# Patient Record
Sex: Female | Born: 1940 | Race: White | Hispanic: No | Marital: Married | Smoking: Former smoker
Health system: Southern US, Community
[De-identification: ages and names within clinical notes are randomized; demographics above are authoritative.]

## PROBLEM LIST (undated history)

## (undated) DIAGNOSIS — I10 Essential (primary) hypertension: Secondary | ICD-10-CM

## (undated) DIAGNOSIS — I739 Peripheral vascular disease, unspecified: Secondary | ICD-10-CM

## (undated) DIAGNOSIS — T8859XA Other complications of anesthesia, initial encounter: Secondary | ICD-10-CM

## (undated) DIAGNOSIS — T4145XA Adverse effect of unspecified anesthetic, initial encounter: Secondary | ICD-10-CM

## (undated) DIAGNOSIS — R0602 Shortness of breath: Secondary | ICD-10-CM

## (undated) DIAGNOSIS — R112 Nausea with vomiting, unspecified: Secondary | ICD-10-CM

## (undated) DIAGNOSIS — Z9889 Other specified postprocedural states: Secondary | ICD-10-CM

## (undated) DIAGNOSIS — R911 Solitary pulmonary nodule: Secondary | ICD-10-CM

## (undated) DIAGNOSIS — F32A Depression, unspecified: Secondary | ICD-10-CM

## (undated) DIAGNOSIS — J4489 Other specified chronic obstructive pulmonary disease: Secondary | ICD-10-CM

## (undated) DIAGNOSIS — E785 Hyperlipidemia, unspecified: Secondary | ICD-10-CM

## (undated) DIAGNOSIS — J449 Chronic obstructive pulmonary disease, unspecified: Secondary | ICD-10-CM

## (undated) DIAGNOSIS — I2510 Atherosclerotic heart disease of native coronary artery without angina pectoris: Secondary | ICD-10-CM

## (undated) DIAGNOSIS — F329 Major depressive disorder, single episode, unspecified: Secondary | ICD-10-CM

## (undated) DIAGNOSIS — M1990 Unspecified osteoarthritis, unspecified site: Secondary | ICD-10-CM

## (undated) HISTORY — DX: Shortness of breath: R06.02

## (undated) HISTORY — DX: Essential (primary) hypertension: I10

## (undated) HISTORY — DX: Depression, unspecified: F32.A

## (undated) HISTORY — DX: Chronic obstructive pulmonary disease, unspecified: J44.9

## (undated) HISTORY — DX: Hyperlipidemia, unspecified: E78.5

## (undated) HISTORY — DX: Solitary pulmonary nodule: R91.1

## (undated) HISTORY — PX: HAMMER TOE SURGERY: SHX385

## (undated) HISTORY — PX: TUBAL LIGATION: SHX77

## (undated) HISTORY — DX: Atherosclerotic heart disease of native coronary artery without angina pectoris: I25.10

## (undated) HISTORY — PX: WRIST SURGERY: SHX841

## (undated) HISTORY — DX: Other specified chronic obstructive pulmonary disease: J44.89

## (undated) HISTORY — DX: Major depressive disorder, single episode, unspecified: F32.9

## (undated) HISTORY — DX: Peripheral vascular disease, unspecified: I73.9

## (undated) HISTORY — PX: OTHER SURGICAL HISTORY: SHX169

---

## 2002-01-07 DIAGNOSIS — R911 Solitary pulmonary nodule: Secondary | ICD-10-CM

## 2002-01-07 HISTORY — DX: Solitary pulmonary nodule: R91.1

## 2003-07-11 HISTORY — PX: CORONARY ARTERY BYPASS GRAFT: SHX141

## 2003-11-17 ENCOUNTER — Inpatient Hospital Stay (HOSPITAL_COMMUNITY): Admission: EM | Admit: 2003-11-17 | Discharge: 2003-11-26 | Payer: Self-pay | Admitting: Cardiology

## 2004-07-18 ENCOUNTER — Ambulatory Visit: Payer: Self-pay | Admitting: Cardiology

## 2004-07-26 ENCOUNTER — Ambulatory Visit: Payer: Self-pay

## 2004-08-05 ENCOUNTER — Ambulatory Visit: Payer: Self-pay | Admitting: Pulmonary Disease

## 2004-08-22 ENCOUNTER — Ambulatory Visit: Payer: Self-pay

## 2004-09-05 ENCOUNTER — Ambulatory Visit: Payer: Self-pay | Admitting: Pulmonary Disease

## 2005-09-07 ENCOUNTER — Ambulatory Visit: Payer: Self-pay | Admitting: Cardiology

## 2006-01-07 IMAGING — CT CT ANGIO CHEST
4 of 5 series · 19 of 30 positions shown · IV contrast ([ID] OMNI 300)
Comparison: none

CLINICAL DATA: Shortness of breath.  The patient is unable to wean off oxygen.  Status post CABG one week ago.
 CT ANGIOGRAPHY OF THE CHEST WITH CONTRAST
 Scans are performed following intravenous injection of 120 cc Omnipaque 300.
 There is no evidence of pulmonary embolus.  There is a moderate left pleural effusion with some compressive atelectasis of the left lower lobe.  There is mild cardiomegaly with no pericardial effusion.  There is a tiny right effusion.  There is some minimal interstitial disease at the right lung apex without discrete consolidation.  There is no significant hilar or mediastinal adenopathy.  Expected post surgical changes in the sternum and mediastinum from the recent CABG.
 IMPRESSION
 No evidence of pulmonary embolus.  Tiny right pleural effusion.  Small left effusion with moderate atelectasis in the left lower lobe.

[Series 5: recon 2: pe · axial · 0.68mm/px · z∈[-182,-87]mm · 4 of 76 slices shown]
[im 19/76  lung]
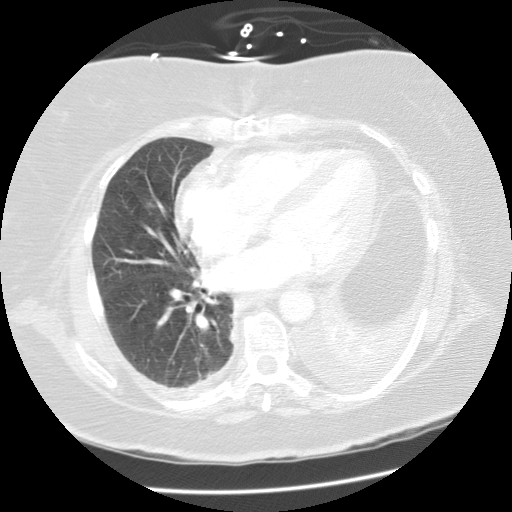
[im 36/76  lung]
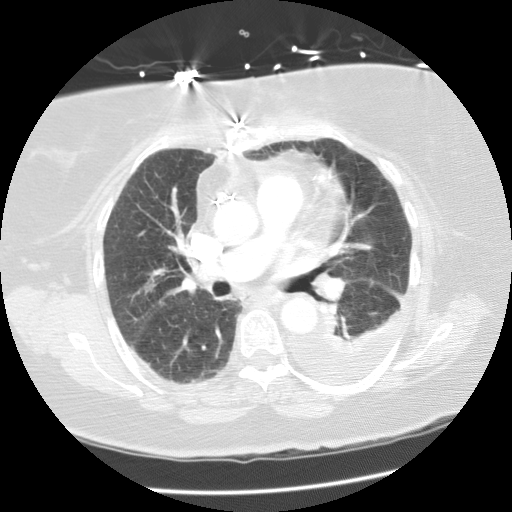
[im 38/76  lung]
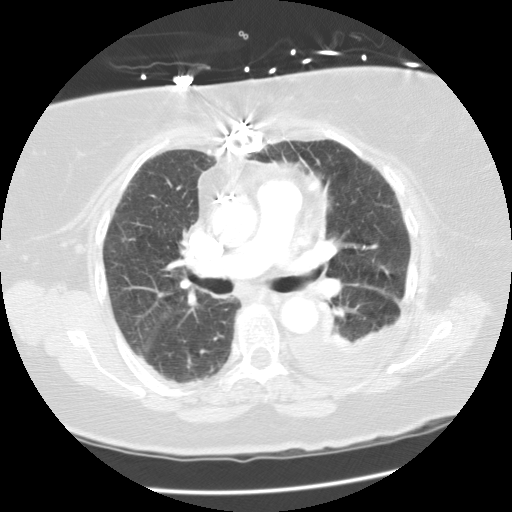
[im 57/76  lung]
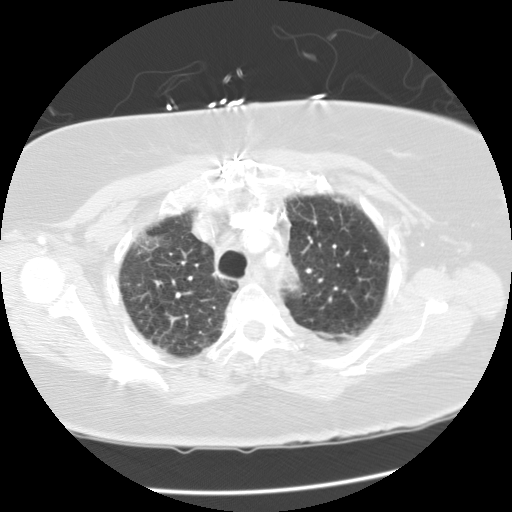

[Series 6: recon 3: pe · axial · 0.68mm/px · z∈[-207,-61]mm · 9 of 151 slices shown]
[im 17/151  lung]
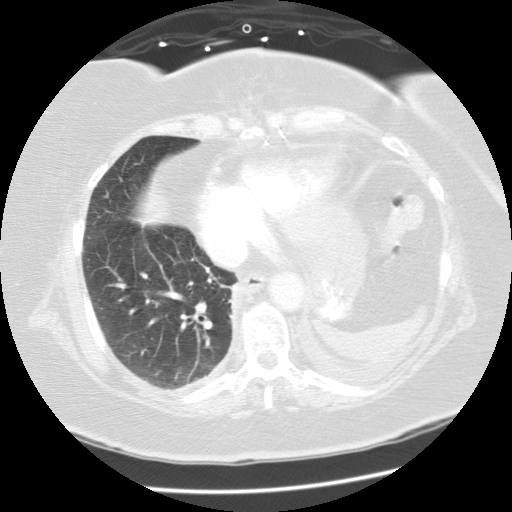
[im 34/151  mediastinal]
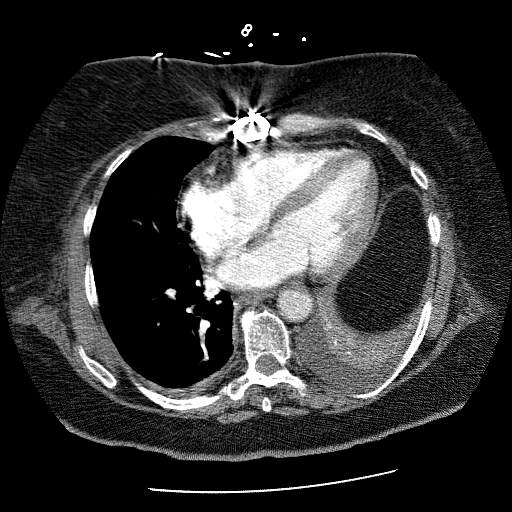
[im 51/151  lung]
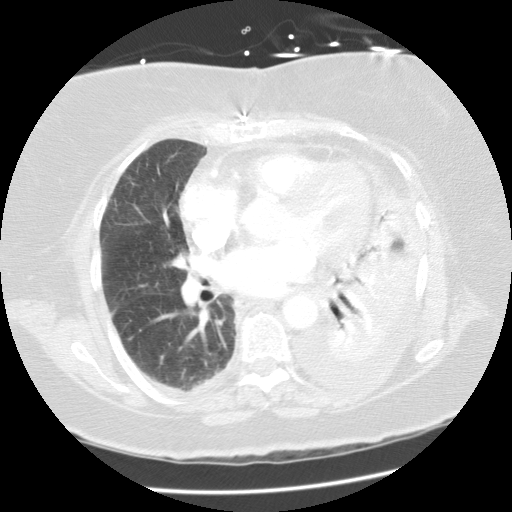
[im 67/151  mediastinal]
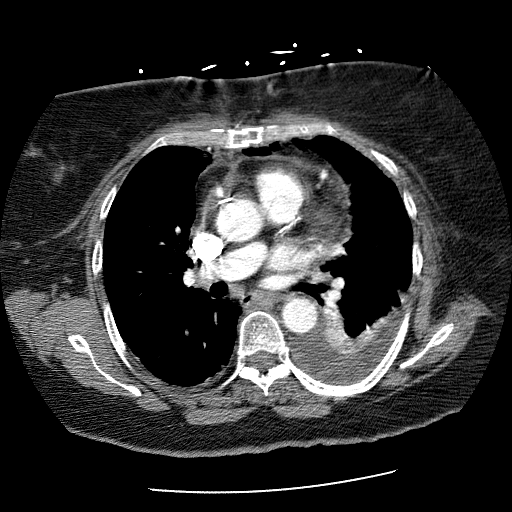
[im 71/151  lung]
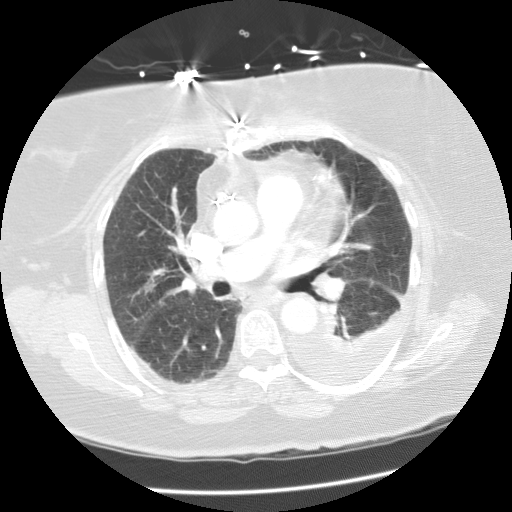
[im 84/151  mediastinal]
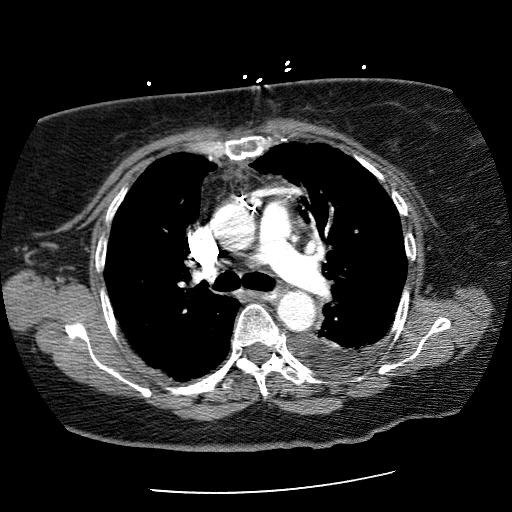
[im 101/151  lung]
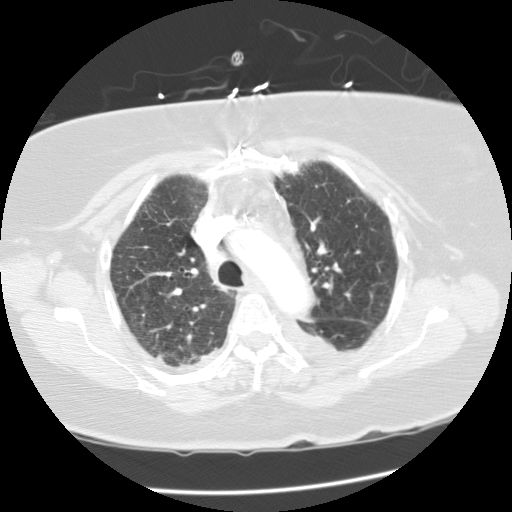
[im 117/151  mediastinal]
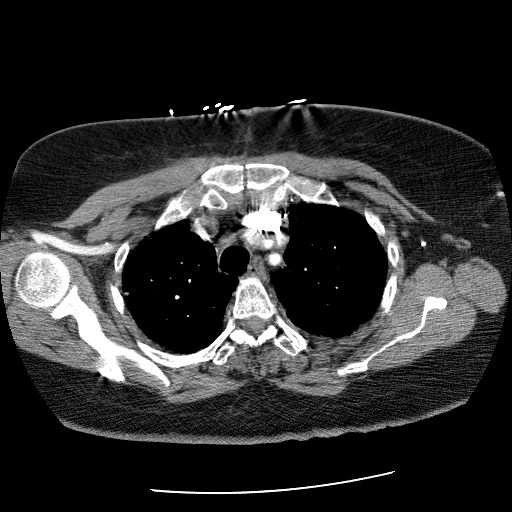
[im 134/151  lung]
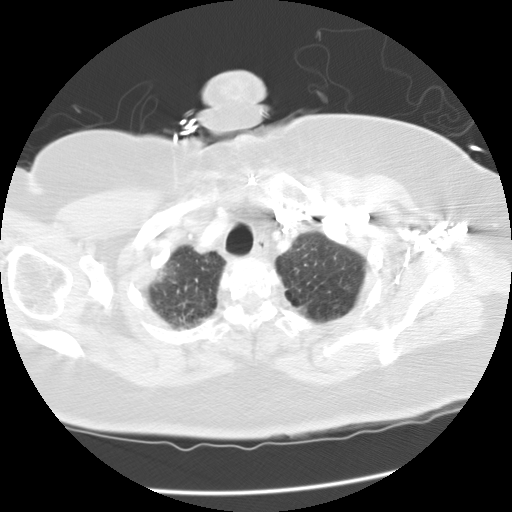

[Series 527: reformatted · sagittal · 0.39mm/px · 4 of 90 slices shown (1 of 2)]
[im 18/90  lung]
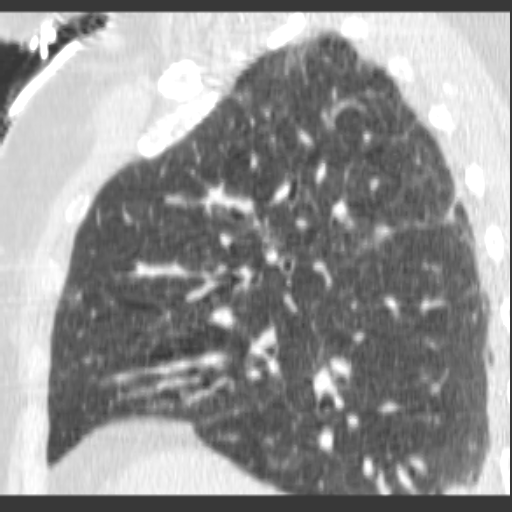
[im 36/90  lung]
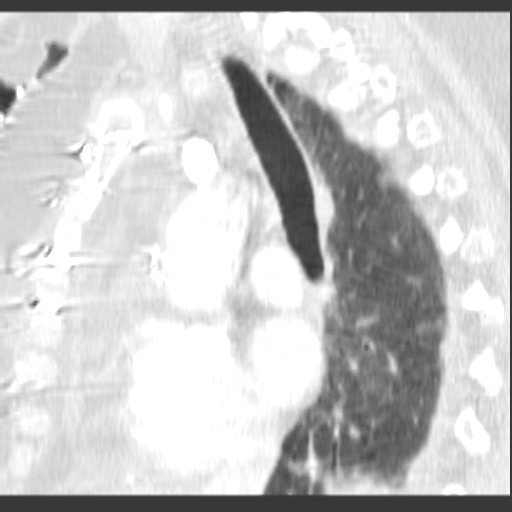
[im 54/90  lung]
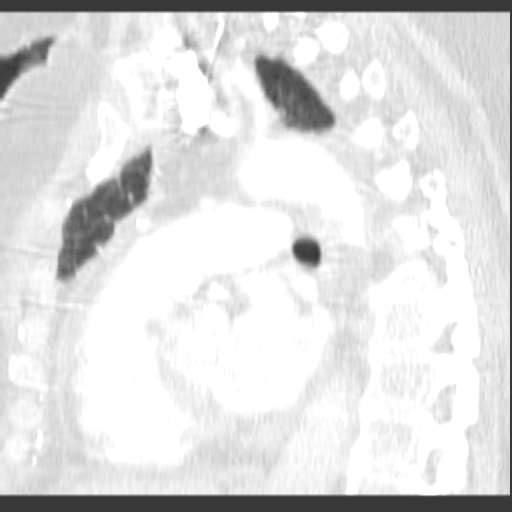
[im 72/90  lung]
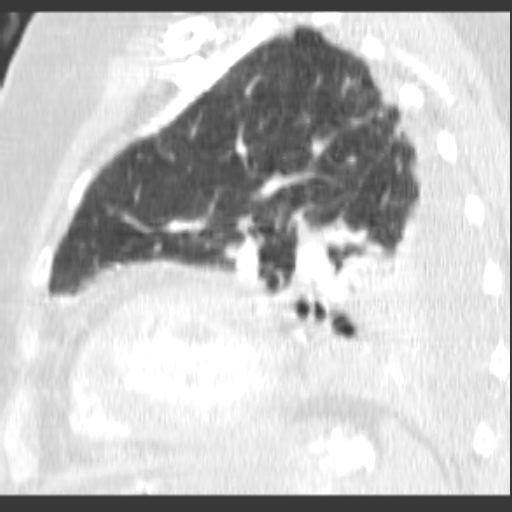

[Series 529: reformatted · coronal · 0.39mm/px · 2 of 96 slices shown (2 of 2)]
[im 20/96  lung]
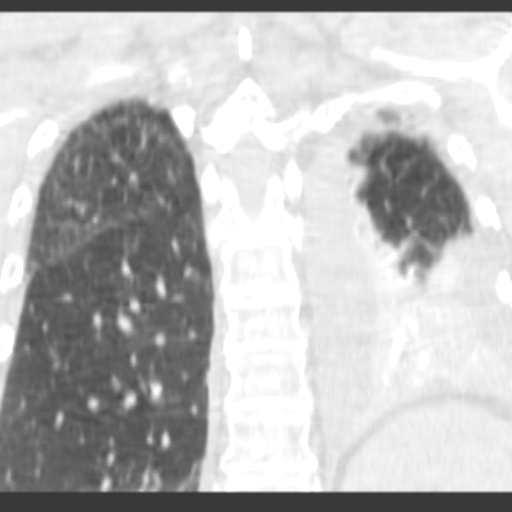
[im 39/96  lung]
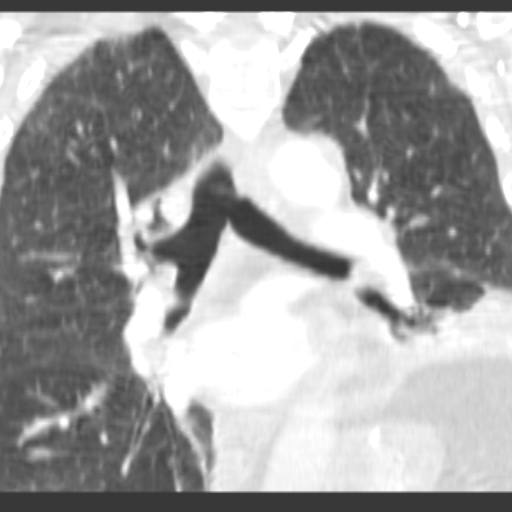

[19 of 30 positions shown; findings below may reference images not displayed]

## 2008-10-01 ENCOUNTER — Encounter: Payer: Self-pay | Admitting: Cardiology

## 2008-10-22 ENCOUNTER — Encounter: Payer: Self-pay | Admitting: Cardiology

## 2008-10-22 ENCOUNTER — Ambulatory Visit: Payer: Self-pay | Admitting: Cardiology

## 2008-10-29 ENCOUNTER — Ambulatory Visit: Payer: Self-pay | Admitting: Cardiology

## 2008-10-29 ENCOUNTER — Encounter: Payer: Self-pay | Admitting: Cardiology

## 2008-11-02 ENCOUNTER — Telehealth: Payer: Self-pay | Admitting: Cardiology

## 2008-11-03 ENCOUNTER — Telehealth: Payer: Self-pay | Admitting: Cardiology

## 2008-11-04 ENCOUNTER — Telehealth (INDEPENDENT_AMBULATORY_CARE_PROVIDER_SITE_OTHER): Payer: Self-pay | Admitting: *Deleted

## 2008-11-05 ENCOUNTER — Telehealth: Payer: Self-pay | Admitting: Cardiology

## 2009-10-14 ENCOUNTER — Telehealth: Payer: Self-pay | Admitting: Cardiology

## 2009-10-15 ENCOUNTER — Telehealth: Payer: Self-pay | Admitting: Cardiology

## 2009-11-17 DIAGNOSIS — E785 Hyperlipidemia, unspecified: Secondary | ICD-10-CM | POA: Insufficient documentation

## 2009-11-17 DIAGNOSIS — J4489 Other specified chronic obstructive pulmonary disease: Secondary | ICD-10-CM | POA: Insufficient documentation

## 2009-11-17 DIAGNOSIS — I10 Essential (primary) hypertension: Secondary | ICD-10-CM

## 2009-11-17 DIAGNOSIS — J449 Chronic obstructive pulmonary disease, unspecified: Secondary | ICD-10-CM | POA: Insufficient documentation

## 2009-11-18 ENCOUNTER — Ambulatory Visit: Payer: Self-pay | Admitting: Cardiology

## 2009-11-19 DIAGNOSIS — E669 Obesity, unspecified: Secondary | ICD-10-CM | POA: Insufficient documentation

## 2009-11-19 DIAGNOSIS — I2510 Atherosclerotic heart disease of native coronary artery without angina pectoris: Secondary | ICD-10-CM | POA: Insufficient documentation

## 2009-11-22 ENCOUNTER — Telehealth (INDEPENDENT_AMBULATORY_CARE_PROVIDER_SITE_OTHER): Payer: Self-pay | Admitting: *Deleted

## 2009-11-29 ENCOUNTER — Telehealth: Payer: Self-pay | Admitting: Cardiology

## 2010-07-31 ENCOUNTER — Encounter: Payer: Self-pay | Admitting: Thoracic Surgery (Cardiothoracic Vascular Surgery)

## 2010-08-08 ENCOUNTER — Telehealth (INDEPENDENT_AMBULATORY_CARE_PROVIDER_SITE_OTHER): Payer: Self-pay | Admitting: *Deleted

## 2010-08-09 NOTE — Progress Notes (Signed)
Summary: Low HR after Increasing Metoprolol  Phone Note Call from Patient Call back at [facility] Phone [phone]   Summary of Call: Pt seen in [facility] office last week. She states she told the girl who was putting in her meds that she takes Lopressor 50mg  1/2 tablet two times a day. This is listed in IDX as 1 tablet two times a day. Pt states she increased her Lopressor to this dosage per Dr. Jenene Slicker recommendation, though, she states again that she has always taken 1/2 tablet two times a day. She states she saw Orvilla Cornwall, PA today and her HR was 51 after increasing Lopressor dosage. She states Keavie told her to contact Dr. Antoine Poche regarding HR being low after increasing dosage. She states she contacted the [facility] office but was told to call the [place] office because Dr. Antoine Poche was here today. Initial call taken by: Cyril Loosen, RN, BSN,  Nov 22, 2009 4:28 PM  Follow-up for Phone Call        Patient should continue the higher dose unless she is having symptoms with her low heart rate.  (Note that the patient will be followed in [place] from now on.) Follow-up by: Rollene Rotunda, MD, [facility],  Nov 22, 2009 4:48 PM  Additional Follow-up for Phone Call Additional follow up Details #1::        Pt notified. Pt denies any symptoms with HR of 51. She will notify us of any problems in the future and continue Lopressor as directed. Additional Follow-up by: Cyril Loosen, RN, BSN,  Nov 22, 2009 5:01 PM

## 2010-08-09 NOTE — Progress Notes (Signed)
Summary: refill  Phone Note Refill Request Call back at Home Phone [phone] Message from:  Patient  Refills  -- Crestor Metoprolol  Ramipril  Prozac       [facility] [place]     Method Requested: Telephone to Pharmacy Initial call taken by: Burnard Leigh,  October 14, 2009 3:38 PM  Follow-up for Phone Call        PT CALLING ABCK THIS MORNING ABOUT HER MEDICATIONS  Judie Grieve  October 15, 2009 9:11 AM    New/Updated Medications: CRESTOR 40 MG TABS (ROSUVASTATIN CALCIUM) Take one tablet by mouth daily. Prescriptions: CRESTOR 40 MG TABS (ROSUVASTATIN CALCIUM) Take one tablet by mouth daily.  #90 x 3   Entered by:   Kem Parkinson   Authorized by:   Rollene Rotunda, MD, [facility]   Signed by:   Kem Parkinson on 10/15/2009   Method used:   Electronically to        [facility] (retail)       [address]       [place], [place]  [address]       Ph: [phone]       Fax: [phone]   RxID:   [id number] RAMIPRIL 5 MG CAPS (RAMIPRIL) 1 tab once daily  #90 x 3   Entered by:   Kem Parkinson   Authorized by:   Rollene Rotunda, MD, [facility]   Signed by:   Kem Parkinson on 10/15/2009   Method used:   Electronically to        [facility] (retail)       [address]       [place], [place]  [address]       Ph: [phone]       Fax: [phone]   RxID:   [id number] LOPRESSOR 50 MG TABS (METOPROLOL TARTRATE) 2  each day  #90 x 3   Entered by:   Kem Parkinson   Authorized by:   Rollene Rotunda, MD, [facility]   Signed by:   Kem Parkinson on 10/15/2009   Method used:   Electronically to        [facility] (retail)       [address]       [place], [place]  [address]       Ph: [phone]       Fax: [phone]   RxID:   [id number]

## 2010-08-09 NOTE — Assessment & Plan Note (Signed)
Summary: yearly/sl   Visit Type:  Follow-up Primary Provider:  Orvilla Cornwall, NP  CC:  CAD/HTN.  History of Present Illness: The patient presents today for followup. Since I last saw her she has had no new cardiovascular complaints. She denies any chest pressure, neck or arm discomfort. She's had no new shortness of breath, PND or orthopnea. She has no palpitations, presyncope or syncope. She is unfortunately not particularly active and is not walking. She does have some aching leg pain and she wonders might be related to the Crestor. She's had none of the symptoms that were her previous angina. Her most exerting activity as vacuuming.  Current Medications (verified): 1)  Adult Aspirin Ec Low Strength 81 Mg Tbec (Aspirin) .... 2 Tablets Every Day 2)  Tylenol Pm Extra Strength 500-25 Mg Tabs (Diphenhydramine-Apap (Sleep)) .... As Needed 3)  Calcium Citrate 200 Mg Tabs (Calcium Citrate) .Marland Kitchen.. 1 Tab Each Day 4)  Multivitamins  Tabs (Multiple Vitamin) .Marland Kitchen.. 1 Tab Once Daily 5)  Crestor 40 Mg Tabs (Rosuvastatin Calcium) .... Take One Tablet By Mouth Daily. 6)  Fluoxetine Hcl 20 Mg Caps (Fluoxetine Hcl) .... 2 Tabs Each Day 7)  Lopressor 50 Mg Tabs (Metoprolol Tartrate) .... 2  Each Day 8)  Ramipril 5 Mg Caps (Ramipril) .Marland Kitchen.. 1 Tab Once Daily  Allergies (verified): 1)  ! Neosporin 2)  ! * Polysporin  Past History:  Past Medical History: Reviewed history from 11/17/2009 and no changes required.  1. Hypertension.  2. Hyperlipidemia.  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  4. History of depression.  5. Small nodule to the right middle lung on CT in July of 2003.  Three     nodules were noted on CT in 2001.  Past Surgical History: CABG (2005 Dr. Laneta Simmers (saphenous vein graft to the     left anterior descending, saphenous vein graft to the ramus intermedius,     saphenous vein graft to the right coronary artery)  Review of Systems       As stated in the HPI and  negative for all other systems.   Vital Signs:  Patient profile:   70 year old female Height:      62 inches Weight:      207 pounds BMI:     38.00 Pulse rate:   64 / minute Resp:     16 per minute BP sitting:   212 / 116  (right arm)  Vitals Entered By: Marrion Coy, CNA (Nov 18, 2009 11:26 AM)  Serial Vital Signs/Assessments:  Time      Position  BP       Pulse  Resp  Temp     By                     196/110                        Marrion Coy, CNA 12:45               155/89   57                    Charolotte Capuchin, RN  Comments: 12:45 BP recheck after recieving Clonidine 0.1 1/2 tablet  By: Charolotte Capuchin, RN    Physical Exam  General:  Well developed, well nourished, in no acute distress. Head:  normocephalic and atraumatic Eyes:  PERRLA/EOM intact; conjunctiva and lids normal. Mouth:  Teeth, gums and palate  normal. Oral mucosa normal. Neck:  Neck supple, no JVD. No masses, thyromegaly or abnormal cervical nodes. Chest Wall:  Well-healed sternotomy scar Lungs:  Clear bilaterally to auscultation and percussion. Abdomen:  Bowel sounds positive; abdomen soft and non-tender without masses, organomegaly, or hernias noted. No hepatosplenomegaly. Msk:  Back normal, normal gait. Muscle strength and tone normal. Extremities:  No clubbing or cyanosis. Neurologic:  Alert and oriented x 3. Skin:  Intact without lesions or rashes. Cervical Nodes:  no significant adenopathy Inguinal Nodes:  no significant adenopathy Psych:  Normal affect.   Detailed Cardiovascular Exam  Neck    Carotids: Carotids full and equal bilaterally without bruits.      Neck Veins: Normal, no JVD.    Heart    Inspection: no deformities or lifts noted.      Palpation: normal PMI with no thrills palpable.      Auscultation: regular rate and rhythm, S1, S2 without murmurs, rubs, gallops, or clicks.    Vascular    Abdominal Aorta: no palpable masses, pulsations, or audible bruits.       Femoral Pulses: normal femoral pulses bilaterally.      Pedal Pulses: normal pedal pulses bilaterally.      Radial Pulses: normal radial pulses bilaterally.      Peripheral Circulation: no clubbing, cyanosis, or edema noted with normal capillary refill.     EKG  Procedure date:  11/19/2009  Findings:      sinus rhythm, rate 64, axis within normal limits, intervals within normal limits, no acute ST-T wave changes.  Impression & Recommendations:  Problem # 1:  CAD (ICD-414.00) She is status post CABG. She is having no symptoms. I encourage increased activity but no further testing is suggested.  Problem # 2:  OBESITY, UNSPECIFIED (ICD-278.00) She understands the need to lose weight with diet and exercise.  Problem # 3:  HYPERTENSION (ICD-401.9) Her blood pressure she says is well controlled at home though typically with systolics at the upper limits of normal. She was hypertensive last time when I saw her. We gave her clonidine to bring her pressure down. It was down as recorded above before she left the office. I will increase her grandmother per L. to 2.5 mg in the evening and 5 mg in the morning. She can keep a blood pressure diary and follow with her primary. Orders: EKG w/ Interpretation (93000)  Patient Instructions: 1)  Your physician recommends that you schedule a follow-up appointment in: 1 yr in [place] office 2)  Your physician recommends that you continue on your current medications as directed. Please refer to the Current Medication list given to you today.

## 2010-08-09 NOTE — Progress Notes (Signed)
     Follow-up for Phone Call       Follow-up by: Rollene Rotunda, MD, [facility],  October 17, 2009 9:23 AM    Additional Follow-up for Phone Call Additional follow up Details #2::    Pt wants to know if Dr. Antoine Poche will fill her prozac....... She states she would like a call back to know if he fills it or not Follow-up by: Kem Parkinson,  October 15, 2009 10:18 AM  Additional Follow-up for Phone Call Additional follow up Details #3:: Details for Additional Follow-up Action Taken: I will not fill Prozac.  Needs to come from primary MD.  Pt is aware she needs to contact her primary MD. for Prozac. Lisabeth Devoid RN BSN Additional Follow-up by: Rollene Rotunda, MD, [facility],  October 17, 2009 9:24 AM

## 2010-08-09 NOTE — Progress Notes (Signed)
Summary: question regarding meds - metoprolol  Phone Note Call from Patient Call back at Home Phone [phone]   Caller: Patient Reason for Call: Talk to Nurse Summary of Call: per pt calling, has question regarding dosage of meds metoprolol 50 mg  Initial call taken by: Lorne Skeens,  Nov 29, 2009 10:02 AM  Follow-up for Phone Call        Pt states she saw PA at primary MD Orvilla Cornwall). She states now that she has been taking 1 tablet two times a day of the Metoprolol she feels lethargic but denies any dizziness, near-syncope or other symptoms. She will try the increased dose of Metoprolol for a week or so and go back to 1/2 tablet twice per day if unable to tolerate.  Follow-up by: Cyril Loosen, RN, BSN,  Nov 29, 2009 4:27 PM

## 2010-08-17 NOTE — Progress Notes (Signed)
Summary: refill request  Phone Note Refill Request Message from:  Patient on August 08, 2010 9:53 AM  metoprolol to take two a day per dr hochrein/needs new rx for the change/eden called to say dr hochrein in [address] should take care of it   Method Requested: Telephone to Pharmacy Initial call taken by: Glynda Jaeger,  August 08, 2010 9:54 AM    Prescriptions: LOPRESSOR 50 MG TABS (METOPROLOL TARTRATE) 2  each day  #180 x 1   Entered by:   Carlye Grippe   Authorized by:   Rollene Rotunda, MD, [facility]   Signed by:   Carlye Grippe on 08/08/2010   Method used:   Electronically to        [facility] (retail)       [address]       [place], [place]  [address]       Ph: [phone]       Fax: [phone]   RxID:   [id number]

## 2010-11-08 ENCOUNTER — Encounter: Payer: Self-pay | Admitting: Cardiology

## 2010-11-08 ENCOUNTER — Ambulatory Visit (INDEPENDENT_AMBULATORY_CARE_PROVIDER_SITE_OTHER): Payer: Medicare Other | Admitting: Cardiology

## 2010-11-08 DIAGNOSIS — E785 Hyperlipidemia, unspecified: Secondary | ICD-10-CM

## 2010-11-08 DIAGNOSIS — I10 Essential (primary) hypertension: Secondary | ICD-10-CM

## 2010-11-08 DIAGNOSIS — I2510 Atherosclerotic heart disease of native coronary artery without angina pectoris: Secondary | ICD-10-CM

## 2010-11-08 DIAGNOSIS — E669 Obesity, unspecified: Secondary | ICD-10-CM

## 2010-11-08 NOTE — Progress Notes (Signed)
HPI The patient presents for yearly followup. Since I last saw her she has had no new cardiovascular problem. She walks 10 minutes on the treadmill daily. With this she denies any chest pressure, neck or arm discomfort. She has no palpitations, presyncope or syncope. She does get some shortness of breath with activity but this is baseline. She denies any PND or orthopnea. She has no weight gain or edema. She has unfortunately been unable to lose weight.  Allergies  Allergen Reactions  . Bacitracin-Polymyxin B   . Triple Antibiotic     Current Outpatient Prescriptions  Medication Sig Dispense Refill  . aspirin 81 MG tablet Take 162 mg by mouth daily.        . Calcium Carbonate-Vitamin D (CALTRATE 600+D) 600-400 MG-UNIT per tablet Take 4 tablets by mouth daily.        . diphenhydramine-acetaminophen (TYLENOL PM) 25-500 MG TABS Take 1 tablet by mouth at bedtime as needed.        Marland Kitchen FLUoxetine (PROZAC) 20 MG capsule Take 40 mg by mouth daily.        . metoprolol (LOPRESSOR) 50 MG tablet Take 50 mg by mouth 2 (two) times daily.        . Multiple Vitamin (MULTIVITAMIN) tablet Take 1 tablet by mouth daily.        . rosuvastatin (CRESTOR) 40 MG tablet Take 20 mg by mouth daily.          Past Medical History  Diagnosis Date  . Hypertension   . Hyperlipidemia   . 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  . Depression     History   . Nodule of right lung 01/2002    CT SCAN.  Three nodules were noted on CT in 2001.  Marland Kitchen Shortness of breath     Past Surgical History  Procedure Date  . Coronary artery bypass graft 2005    (saphenous vein graft to the  left anterior descending, saphenous vein graft to the ramus intermedius     saphenous vein graft to the right coronary artery  . Re-excision prepatellar bursa     Left Knee    ROS: As stated in the HPI and negative for all other systems.  PHYSICAL EXAM BP 168/79  Pulse 60  Ht 5\' 1"  (1.549 m)  Wt 213 lb  (96.616 kg)  BMI 40.25 kg/m2  SpO2 97% GENERAL:  Well appearing HEENT:  Pupils equal round and reactive, fundi not visualized, oral mucosa unremarkable NECK:  No jugular venous distention, waveform within normal limits, carotid upstroke brisk and symmetric, no bruits, no thyromegaly LYMPHATICS:  No cervical, inguinal adenopathy LUNGS:  Clear to auscultation bilaterally BACK:  No CVA tenderness CHEST:  Well healed sternotomy scar. HEART:  PMI not displaced or sustained,S1 and S2 within normal limits, no S3, no S4, no clicks, no rubs, no murmurs ABD:  Flat, positive bowel sounds normal in frequency in pitch, no bruits, no rebound, no guarding, no midline pulsatile mass, no hepatomegaly, no splenomegaly EXT:  2 plus pulses throughout, no edema, no cyanosis no clubbing SKIN:  No rashes no nodules NEURO:  Cranial nerves II through XII grossly intact, motor grossly intact throughout PSYCH:  Cognitively intact, oriented to person place and time  EKG:  Sinus rhythm, axis within normal limits, intervals within normal limits, no acute ST-T wave changes  ASSESSMENT AND PLAN

## 2010-11-08 NOTE — Assessment & Plan Note (Signed)
I reviewed her blood pressure diary. Her blood pressures typically well controlled though elevated today. I will make no change to her regimen.

## 2010-11-08 NOTE — Patient Instructions (Signed)
Your physician you to follow up in 1 year. You will receive a reminder letter in the mail one-two months in advance. If you don't receive a letter, please call our office to schedule the follow-up appointment. Your physician recommends that you continue on your current medications as directed. Please refer to the Current Medication list given to you today. 

## 2010-11-08 NOTE — Assessment & Plan Note (Signed)
The patient understands the need to lose weight with diet and exercise. We have discussed specific strategies for this.  

## 2010-11-08 NOTE — Assessment & Plan Note (Signed)
At this point I will make no change her medical regimen. She will continue with the meds as listed.

## 2010-11-08 NOTE — Assessment & Plan Note (Signed)
I will defer to her primary provider with a goal LDL less than 100 HDL greater than 40.

## 2010-11-25 NOTE — Cardiovascular Report (Signed)
NAME:  Annette Cooper, THUNE NO.:  [id number]   MEDICAL RECORD NO.:  [id number]                   PATIENT TYPE:  INP   LOCATION:  2310                                 FACILITY:  [facility]   PHYSICIAN:  Rollene Rotunda, M.D.                DATE OF BIRTH:  02-20-1941   DATE OF PROCEDURE:  07/09/2004  DATE OF DISCHARGE:                              CARDIAC CATHETERIZATION   PRIMARY CARE PHYSICIAN:  Dr. Weyman Pedro.   PROCEDURE:  Left heart catheterization, coronary arteriography.   INDICATION:  Evaluate patient with unstable angina.   PROCEDURE NOTE:  Left heart catheterization was performed via the right  femoral artery.  The artery was cannulated using an anterior wall puncture.  A 6 French arterial sheath was inserted via the modified Seldinger  technique.  Preformed Judkins and a pigtail catheter were utilized.  The  patient tolerated the procedure well and left the lab in stable condition.   RESULTS:   HEMODYNAMICS:  1. LV 183/14.  2. Aortic 181/83.   CORONARIES:  The left main had ostial 90% stenosis and distal 30% stenosis.  The LAD had proximal calcium with a long 30-40% lesion and mid 50% stenosis.  There were three small diagonals.  The circumflex in the AV groove was  normal.  There was a very large ramus intermediate which was normal.  The  right coronary artery was dominant.  There was long proximal 75% stenosis.   LEFT VENTRICULOGRAM:  Left ventriculogram was obtained in the RAO  projection.  The EF was 60% with normal wall motion.   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   PLAN:  The patient will be referred to CVTS for consideration of coronary  artery bypass graft.                                               Rollene Rotunda, M.D.    JH/MEDQ  D:  11/18/2003  T:  11/19/2003  Job:  [id number]   cc:   Dr. Weyman Pedro   [facility]

## 2010-11-25 NOTE — Discharge Summary (Signed)
NAME:  Annette Cooper, Annette Cooper                         ACCOUNT NO.:  [id number]   MEDICAL RECORD NO.:  [id number]                   PATIENT TYPE:  INP   LOCATION:  2020                                 FACILITY:  [facility]   PHYSICIAN:  Salvatore Decent. Dorris Fetch, M.D.         DATE OF BIRTH:  11/18/1940   DATE OF ADMISSION:  11/17/2003  DATE OF DISCHARGE:  11/24/2003                                 DISCHARGE SUMMARY   REFERRING PHYSICIAN:  Viviann Spare C. Dorris Fetch, M.D.   ADMISSION DIAGNOSIS:  Chest discomfort.   DISCHARGE DIAGNOSES:  1. 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  2. Postoperative anemia, status post transfusion.   PAST MEDICAL HISTORY:  1. Hypertension.  2. Hyperlipidemia.  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  4. History of depression.  5. Small nodule to the right middle lung on CT in July of 2003.  Three     nodules were noted on CT in 2001.   ALLERGIES:  No known drug allergies.  The patient expresses an intolerance  to NEOSPORIN.   HISTORY OF PRESENT ILLNESS:  The patient is a 70 year old female with a  history of hypertension and hyperlipidemia.  The patient had approximately  four episodes of chest discomfort over the last year with most recent  episode in February when she was admitted to rule out myocardial infarction  in [place].  The patient underwent a Cardiolite study there which was  negative for ischemia as interpreted by Dr. Myrtis Ser.  On the Sunday prior to  admission, the patient had a sudden onset of heaviness in her left shoulder  and left arm which radiated deep into the substernal area and then into the  throat.  These symptoms lasted approximately 15 to 20 minutes.  She did not  have nitroglycerin but was able to rest and her symptoms eventually  subsided.  The patient did have accompanying shortness of breath with  diaphoresis and nausea but no vomiting.  She was  subsequently admitted to  [facility] by Dr. Raul Del who felt the patient should undergo  coronary angiography.  The patient was then transferred from [facility] in [place], [place], via [facility] to [place]. [address] [facility] for a diagnostic heart catheterization.   HOSPITAL COURSE:  The patient was admitted and taken for cardiac  catheterization on Nov 17, 2003, by Dr. Rollene Rotunda.  Cardiac  catheterization revealed severe three vessel coronary artery disease and a  well-preserved ejection fraction.  Dr. Salvatore Decent. Hendrickson of [facility] was  subsequently consulted regarding surgical revascularization.  Dr.  Dorris Fetch evaluated the patient and reviewed the information on Nov 18, 2003, and it was his opinion that the patient should undergo coronary artery  bypass graft surgery.  The patient was taken to the OR on Nov 19, 2003, for  coronary artery bypass grafting x3.  The  saphenous vein was grafted to the  left anterior descending, saphenous vein was grafted to the ramus  intermedius, and saphenous vein was grafted to the right coronary artery.  Endoscopic vein harvest was performed on the left leg.  The patient  tolerated the procedure well and was hemodynamically stable immediately  postoperatively.  The patient was transferred from the OR to the SICU in  stable condition.  The patient was extubated without complications, awoke  from anesthesia neurologically intact.  The evening of the operative day,  the patient was noted to have a hemoglobin of 7.8 and was therefore  transfused  with one unit of rbc.   On postoperative day #1, the patient's anemia was noted to be improved with  a hemoglobin of 9.2 and hematocrit of  27 after transfusion.  The patient  was in stable condition and was maintaining a sinus rhythm.  The patient's  mediastinal tubes were discontinued.   On postoperative #2, the patient was still requiring four liters of oxygen  via nasal  cannula.  She maintained her oxygen saturation.  At that time it  was 93%.  The patient was started on a flutter valve and encouraged to  utilize her incentive spirometry.  The patient also initiated cardiac rehab  on postoperative day #2.  The patient was transferred from the SICU to unit  2000 without complications.  The patient was diuresed continuously  postoperatively as she was volume overloaded.  The patient continued to  tolerate cardiac rehab well.   On postoperative day #5, the patient was without complaint.  She was  ambulating well and was afebrile and her vital signs were stable with a  blood pressure of 149/78, heart rate of 68 and O2 saturation of 96% on room  air.  She was maintaining normal sinus rhythm.  On physical examination  cardiac was regular rate and rhythm.  There were expiratory wheezes present  throughout bilaterally.  The abdomen was obese, soft and there were bowel  sounds auscultated x4.  Incisions were clean, dry and intact and there was  trace edema present in the bilateral lower extremities.  The patient is  still several pounds up from her preoperative weight of 209.  Her current  weight is 215. The patient will be continued on a Combivent inhaler and  encouraged to use her incentive spirometer.  The oxygen is being weaned and  she is tolerating this well.  The patient will also need to continue  diuresis and will be discharged on Lasix.  The patient is progressing at a  steady rate and it is felt that as long as she continues in her current  manner, will be ready for discharge within the next one to two  days.   LABORATORY DATA:  CBC and BMP on Nov 24, 2003, white count 10.8, hemoglobin  9.3, hematocrit 27.7, platelets 220; sodium 140, potassium 3.9, BUN 13,  creatinine 0.7, glucose 134.   CONDITION ON DISCHARGE:  Improved.   DISCHARGE INSTRUCTIONS:  1. Medications:     a. Aspirin 325 mg daily.    b. Toprol XL 50 mg daily.     c. Altace 2.5 mg  daily.     d. Lipitor 20 mg daily.     e. Prozac 40 mg daily.     f. Colace 100 mg two tablets daily p.r.n. constipation.     g. Lasix 40 mg daily x7 days.     h. K-Dur 20 mEq daily x7 days.  2. Pain management:  Tylox one to two tablets p.o. q.4-6h. p.r.n. pain.  3. Activity:  No driving, no lifting more than 10 pounds and the patient is     to continue daily breathing and walking exercises.  4. Diet:  Low salt, low fat.  5. Wound care:  The patient may shower daily and clean the incisions with     soap and water.  If the incisions should become red, swollen or draining,     or if the patient has a fever of 101 degrees F, she is to call the [facility]     office.  6. Follow-up:     a. The patient will have a follow-up appointment established by [facility]        Cardiology by Dr. Antoine Poche for two weeks after discharge.  The patient        will have a chest x-ray taken at that appointment.     b. Dr. Dorris Fetch on December 29, 2003, at 11:30 a.m.  The patient is to        bring the chest x-ray taken by [place]        to this appointment with Dr. Dorris Fetch.     c. Dr. Nena Jordan.  The patient is to call his office and set up an        appointment for approximately two to three weeks after discharge.      Pecola Leisure, PA                      Salvatore Decent. Dorris Fetch, M.D.    AY/MEDQ  D:  11/24/2003  T:  11/25/2003  Job:  [id number]   cc:   Nena Jordan

## 2010-11-25 NOTE — Op Note (Signed)
NAME:  Annette Cooper, Annette Cooper                         ACCOUNT NO.:  [id number]   MEDICAL RECORD NO.:  [id number]                   PATIENT TYPE:  INP   LOCATION:  2310                                 FACILITY:  [facility]   PHYSICIAN:  Salvatore Decent. Dorris Fetch, M.D.         DATE OF BIRTH:  May 12, 1941   DATE OF PROCEDURE:  11/19/2003  DATE OF DISCHARGE:                                 OPERATIVE REPORT   PREOPERATIVE DIAGNOSIS:  Left main and three vessel disease with unstable  angina.   POSTOPERATIVE DIAGNOSIS:  Left main and three vessel disease with unstable  angina.   PROCEDURE:  1. Median sternotomy.  2. Extracorporeal circulation.  3. Coronary artery bypass grafting times three (saphenous vein graft to the     left anterior descending, saphenous vein graft to the ramus intermedius,     saphenous vein graft to the right coronary artery).  4. Endoscopic vein harvest, left leg.   SURGEON:  Salvatore Decent. Dorris Fetch, M.D.   ASSISTANT:  Pecola Leisure, PATIENT   ANESTHESIA:  General.   FINDINGS:  Saphenous vein of good quality in the left leg.  Unable to  localize the saphenous vein in the right leg.  Mammary artery is of normal  caliber but with poor flow and taken down for use as a free graft.  There  was delamination of the vessel with the vessel being unusable.  Left  ventricular hypertrophy.   CLINICAL NOTE:  The patient is a 70 year old white female with a strong  family history of coronary artery disease and multiple cardiac risk factors.  She had four episodes of anginal like chest pain.  In February she had been  admitted and had a negative stress test.  She had a prolonged episode of  chest pain on the Sunday prior to admission.  She was admitted and underwent  cardiac catheterization where she was found to have a 90% ostial left main  stenosis as well as an approximately 70% right coronary artery stenosis.  The patient was referred for coronary artery bypass grafting.  The  indications, risks, benefits and alternatives were discussed in detail with  the patient.  She understood and accepted the risks and agreed to proceed.   DESCRIPTION OF PROCEDURE:  The patient was brought to the preoperative  holding area on Nov 19, 2003.  Lines were placed to monitor arterial,  central venous and pulmonary arterial pressure.  Intravenous antibiotics  were administered.  The patient was taken to the operating room,  anesthetized and intubated. A Foley catheter was placed. The chest, abdomen  and legs were prepped and draped in the usual fashion.   An incision was made in the medial aspect of the right leg at the level of  the knee.  The incision was explored to try and localize the greater  saphenous vein.  However, the vein could not be localized at that level and  the vein was not harvested  from the right leg.  An incision was made at the  level of the knee on the left leg.  The greater saphenous vein was  identified there and was harvested from the left leg endoscopically.  This  was a good quality vein.   A median sternotomy was performed. Of note, the patient was obese and there  was sternal osteoporosis.  The left internal mammary artery was taken down  in standard fashion. The mammary takedown was performed by double clipping  all branches and dividing with scissors.  The vessel came off the chest wall  easily without any undue difficulty.  The patient was fully heparinized.  The distal end of the mammary artery was divided.  There was sluggish flow  through the vessel.  The distal limb was spatulated and probed retrograde  with a 1.5 mm probe which passed easily up to its proximal portion.  There  was slight improvement in the flow.  The mammary was placed in a papaverine  soaked sponge and placed into the left pleural space.   The pericardium was opened and the ascending aorta was inspected and  palpated.  There was no palpable atherosclerotic disease.  The  aorta was  cannulated via concentric 2-0 Ethibond pledget of pursestring sutures.  A  dual stage venous cannula was placed via a pursestring suture in the right  atrial appendage.  Cardiopulmonary bypass was instituted and the patient was  cooled to 32 degrees Celsius.  The coronary arteries were inspected and  anastomotic sites were chosen.  The conduits were inspected and cut to  length.  A foam pad was placed in the pericardium to protect the left  phrenic nerve.  A temperature probe was placed in the myocardial septum and  a cardioplegia cannula was placed in the ascending aorta.   The aorta was cross clamped and the left ventricle was emptied via the  aortic root bank.  Cardiac arrest was achieved with a combination of cold  antegrade blood cardioplegia and topical iced saline.  After achieving  complete diastolic arrest and a myocardial septal temperature of 9 degrees  Celsius, the following distal anastomoses were performed.   First, a reverse saphenous vein graft was placed end to side to the distal  right coronary artery.  This was a 2 mm, good quality target.  The vein  graft was good quality conduit and the anastomosis was performed end to side  with a running 7-0 Prolene suture.  There was excellent flow through the  graft.  Cardioplegia was administered and there was good hemostasis.   Next, a reverse saphenous vein graft was placed end to side to the ramus  intermedius.  This was the larger of the anterolateral branches and much  larger than the circumflex.  It was an intramyocardial vessel.  It was 2 mm  in diameter and an end to side anastomosis was performed with the vein graft  which was of good quality again using a running 7-0 Prolene suture. Again,  there was excellent flow through this graft.  Cardioplegia was administered  and again there was good hemostasis.  Next, the left internal mammary artery was brought through a window in the  pericardium.  The bulldog  clamp was removed from the mammary again and again  flow was sluggish and the decision was made to take this down and use as a  free graft.  The vessel appeared to be intact.  There was no evidence of  dissection.  The vessel was divided proximally and the proximal stump was  suture ligated with a 2-0 silk suture. A 1.5 mm probe passed easily both  proximally and distally.  The distal end was spatulated and was anastomosed  end to side to the mid LAD which was a 2 mm good quality target.  This was  performed with a running 8-0 Prolene suture.  The graft flushed easily with  good flow through the graft.   Additional cardioplegia then was administered.  The cardioplegia cannula was  then removed from the ascending aorta and the proximal vein graft  anastomoses were performed with 4.4 mm punch aortotomies with running 6-0  Prolene sutures.  At the completion of the final proximal anastomosis, the  patient was placed in Trendelenburg position.  The aortic root was de-aired  and the aortic cross clamp was removed.  Total cross clamp time was 61  minutes.   Bulldog clamps were then placed proximally and distally on the saphenous  vein graft to the ramus intermedius.  A longitudinal venotomy was performed.  The proximal end of the mammary then was cut to length and spatulated.  It  was noted that this portion of the mammary artery, the vessel delaminated  with the intima separating from the adventitia.  Both were tissue paper  thin.  An attempt to perform the proximal anastomosis of the intima layer  was very thin and would not hold suture.  The vessel was divided slightly  more distally and again the vessel totally delaminated.  There then was  insufficient length.   A cardioplegia cannula was once again placed in the ascending aorta.  The  patient had been rewarmed and was not re-cooled for the second cross clamp  time.  The aorta was cross clamped and cardioplegic arrest was then once   again achieved.  The septal cooling was adequate.  The mammary artery to the  LAD anastomosis was taken down and a segment of reverse saphenous vein graft  was then placed to the LAD using a running 7-0 Prolene suture.  The  anastomosis was probed proximally and distally.  The graft flushed easily  and there was excellent flow.  The aortic cross clamp was removed.  There  was good back bleeding from the vein graft.  The second cross clamp time was  14 minutes.   Again, bulldog clamps were placed proximally and distally on the vein graft  to the ramus intermedius and a wide graft was constructed with the proximal  LAD anastomosis coming off the longitudinal venotomy on the ramus vein  graft.  This anastomosis was performed with a running 7-0 Prolene suture.  The anastomosis was de-aired and the patient was placed in Trendelenburg position. The bulldog clamps were removed.  All proximal and distal  anastomoses were inspected for hemostasis.  Epicardial pacing wires were  placed on the right ventricle and right atrium and DDD pacing was initiated.  When the patient had been re-warmed to a core temperature of 37 degrees  Celsius, a low dose Dopamine infusion was initiated and the patient was then  weaned from cardiopulmonary bypass.  Total bypass time was 182 minutes.  The  initial cardiac index was greater than two liters per minute per meter  squared and the patient remained hemodynamically stable throughout the post  bypass period.   A test dose of Protamine was administered and was well tolerated.  The  atrial and aortic cannulae were removed and the remainder of the  Protamine  was administered without incident.  The chest was irrigated with one liter  of warm normal saline containing 1 gram of vancomycin.  Hemostasis was  achieved.  A left pleural and two mediastinal chest tubes were placed  through separate subcostal incisions.  The pericardium was reapproximated  with interrupted 3-0  silk sutures that came together easily without tension.  The sternum was closed with a combination of single and double stainless  steel wires.  The remainder of the incisions were closed in standard  fashion.  Subcuticular closure was used for the skin.  All sponge, needle  and instrument counts were correct at the end of the procedure.  The patient  was taken from the operating room to the surgical intensive care unit  intubated and in critical but stable condition.                                               Salvatore Decent Dorris Fetch, M.D.    SCH/MEDQ  D:  11/19/2003  T:  11/20/2003  Job:  [id number]

## 2011-01-21 ENCOUNTER — Other Ambulatory Visit: Payer: Self-pay | Admitting: Cardiology

## 2011-04-26 ENCOUNTER — Other Ambulatory Visit: Payer: Self-pay | Admitting: Cardiology

## 2011-05-04 ENCOUNTER — Other Ambulatory Visit: Payer: Self-pay | Admitting: Cardiology

## 2011-12-12 ENCOUNTER — Ambulatory Visit: Payer: Medicare Other | Admitting: Cardiology

## 2011-12-12 ENCOUNTER — Encounter: Payer: Self-pay | Admitting: *Deleted

## 2011-12-12 ENCOUNTER — Ambulatory Visit (INDEPENDENT_AMBULATORY_CARE_PROVIDER_SITE_OTHER): Payer: Medicare Other | Admitting: Cardiology

## 2011-12-12 ENCOUNTER — Encounter: Payer: Self-pay | Admitting: Cardiology

## 2011-12-12 VITALS — BP 144/70 | HR 55 | Resp 18 | Ht 61.0 in | Wt 217.0 lb

## 2011-12-12 DIAGNOSIS — R0602 Shortness of breath: Secondary | ICD-10-CM

## 2011-12-12 DIAGNOSIS — I2510 Atherosclerotic heart disease of native coronary artery without angina pectoris: Secondary | ICD-10-CM

## 2011-12-12 DIAGNOSIS — E785 Hyperlipidemia, unspecified: Secondary | ICD-10-CM

## 2011-12-12 NOTE — Assessment & Plan Note (Signed)
This is followed by her primary provider and I would suggest a goal LDL less than 100 and HDL greater than 40.

## 2011-12-12 NOTE — Patient Instructions (Addendum)
Your physician recommends that you schedule a follow-up appointment in: 1 year. You will receive a reminder letter in the mail in about 10 months reminding you to call and schedule your appointment. If you don't receive this letter, please contact our office. Your physician recommends that you continue on your current medications as directed. Please refer to the Current Medication list given to you today. Your physician has requested that you have a lexiscan myoview. For further information please visit [email or web address]. Please follow instruction sheet, as given.  

## 2011-12-12 NOTE — Progress Notes (Signed)
HPI The patient presents for yearly followup. Since I last saw her she has had no new cardiovascular problem. She has been limited by orthopedic problems particularly a hammertoe surgery. She also fractured her wrist. She has not been walking on her treadmill when she was previously. The patient denies any new symptoms such as chest discomfort, neck or arm discomfort. There has been no new shortness of breath, PND or orthopnea. There have been no reported palpitations, presyncope or syncope.  Allergies  Allergen Reactions  . Bacitracin-Polymyxin B   . Neomycin-Bacitracin Zn-Polymyx     Current Outpatient Prescriptions  Medication Sig Dispense Refill  . aspirin 81 MG tablet Take 162 mg by mouth daily.        . Calcium Carbonate-Vitamin D (CALTRATE 600+D) 600-400 MG-UNIT per tablet Take 4 tablets by mouth daily.        . Cholecalciferol (VITAMIN D3) 1000 UNITS CAPS Take by mouth 2 (two) times daily.      . diphenhydramine-acetaminophen (TYLENOL PM) 25-500 MG TABS Take 1 tablet by mouth at bedtime as needed.        Marland Kitchen FLUoxetine (PROZAC) 20 MG capsule Take 40 mg by mouth daily.        . metoprolol (LOPRESSOR) 50 MG tablet TAKE TWO TABLETS BY MOUTH EVERY DAY  180 tablet  3  . Multiple Vitamin (MULTIVITAMIN) tablet Take 1 tablet by mouth daily.        . Omega-3 Fatty Acids (FISH OIL) 1200 MG CAPS Take by mouth daily.      . ramipril (ALTACE) 5 MG capsule Take 5 mg by mouth daily.      . rosuvastatin (CRESTOR) 40 MG tablet       . DISCONTD: CRESTOR 40 MG tablet TAKE ONE TABLET BY MOUTH EVERY DAY  90 each  4    Past Medical History  Diagnosis Date  . Hypertension   . Hyperlipidemia   . 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  . Depression     History   . Nodule of right lung 01/2002    CT SCAN.  Three nodules were noted on CT in 2001.  Marland Kitchen Shortness of breath     Past Surgical History  Procedure Date  . Coronary artery bypass graft 2005    (saphenous  vein graft to the  left anterior descending, saphenous vein graft to the ramus intermedius     saphenous vein graft to the right coronary artery  . Re-excision prepatellar bursa     Left Knee    ROS: As stated in the HPI and negative for all other systems.  PHYSICAL EXAM BP 144/70  Pulse 55  Resp 18  Ht 5\' 1"  (1.549 m)  Wt 217 lb (98.431 kg)  BMI 41.00 kg/m2 GENERAL:  Well appearing HEENT:  Pupils equal round and reactive, fundi not visualized, oral mucosa unremarkable NECK:  No jugular venous distention, waveform within normal limits, carotid upstroke brisk and symmetric, no bruits, no thyromegaly LYMPHATICS:  No cervical, inguinal adenopathy LUNGS:  Clear to auscultation bilaterally BACK:  No CVA tenderness CHEST:  Well healed sternotomy scar. HEART:  PMI not displaced or sustained,S1 and S2 within normal limits, no S3, no S4, no clicks, no rubs, no murmurs ABD:  Flat, positive bowel sounds normal in frequency in pitch, no bruits, no rebound, no guarding, no midline pulsatile mass, no hepatomegaly, no splenomegaly, obese EXT:  2 plus pulses throughout, no edema, no cyanosis no clubbing  SKIN:  No rashes no nodules NEURO:  Cranial nerves II through XII grossly intact, motor grossly intact throughout PSYCH:  Cognitively intact, oriented to person place and time  EKG:  Sinus rhythm, axis within normal limits, intervals within normal limits, no acute ST-T wave changes  ASSESSMENT AND PLAN

## 2011-12-12 NOTE — Assessment & Plan Note (Signed)
She is now 8 years out from her bypass surgery and needs screening with an exercise treadmill test. However, she would not be a walker treadmill so she will have a [employer].

## 2011-12-12 NOTE — Assessment & Plan Note (Signed)
Her blood pressure is very minimally elevated. I would suggest just a few pounds of weight loss will bring this to target and she will otherwise continue the meds as listed.

## 2011-12-12 NOTE — Assessment & Plan Note (Signed)
We have had long discussions about this and she understands the need to lose weight with diet and exercise.

## 2011-12-13 ENCOUNTER — Other Ambulatory Visit: Payer: Self-pay | Admitting: Cardiology

## 2011-12-13 DIAGNOSIS — I2510 Atherosclerotic heart disease of native coronary artery without angina pectoris: Secondary | ICD-10-CM

## 2011-12-13 DIAGNOSIS — R0602 Shortness of breath: Secondary | ICD-10-CM

## 2011-12-20 ENCOUNTER — Telehealth: Payer: Self-pay | Admitting: Cardiology

## 2011-12-20 NOTE — Telephone Encounter (Signed)
Lexiscan Myoview scheduled for 12-27-2011 [facility] Checking percert

## 2011-12-27 DIAGNOSIS — R0602 Shortness of breath: Secondary | ICD-10-CM

## 2011-12-28 NOTE — Telephone Encounter (Signed)
Pt has Medicare and AARP Medicare Supplement.  No precert required. °

## 2012-01-02 ENCOUNTER — Telehealth: Payer: Self-pay | Admitting: *Deleted

## 2012-01-02 NOTE — Telephone Encounter (Signed)
Patient informed. 

## 2012-01-02 NOTE — Telephone Encounter (Signed)
Message copied by Eustace Moore on Tue Jan 02, 2012 11:59 AM ------      Message from: Rollene Rotunda      Created: Fri Dec 29, 2011  9:02 PM       Looks OK.  No change in therapy.

## 2012-11-04 ENCOUNTER — Other Ambulatory Visit: Payer: Self-pay | Admitting: Cardiology

## 2012-11-04 NOTE — Telephone Encounter (Signed)
...   Requested Prescriptions   Pending Prescriptions Disp Refills  . CRESTOR 40 MG tablet [Pharmacy Med Name: CRESTOR 40MG         TAB] 90 tablet 0    Sig: TAKE ONE TABLET BY MOUTH EVERY DAY

## 2012-12-23 ENCOUNTER — Ambulatory Visit: Payer: Medicare Other | Admitting: Cardiology

## 2012-12-23 ENCOUNTER — Ambulatory Visit (INDEPENDENT_AMBULATORY_CARE_PROVIDER_SITE_OTHER): Payer: Medicare Other | Admitting: Cardiology

## 2012-12-23 ENCOUNTER — Encounter: Payer: Self-pay | Admitting: Cardiology

## 2012-12-23 VITALS — BP 173/80 | HR 51 | Ht 61.0 in | Wt 209.0 lb

## 2012-12-23 DIAGNOSIS — E669 Obesity, unspecified: Secondary | ICD-10-CM

## 2012-12-23 DIAGNOSIS — E785 Hyperlipidemia, unspecified: Secondary | ICD-10-CM

## 2012-12-23 DIAGNOSIS — I10 Essential (primary) hypertension: Secondary | ICD-10-CM

## 2012-12-23 DIAGNOSIS — I2510 Atherosclerotic heart disease of native coronary artery without angina pectoris: Secondary | ICD-10-CM

## 2012-12-23 NOTE — Progress Notes (Signed)
HPI The patient presents for yearly followup. Since I last saw her she has had no new cardiovascular problem. She has been walking 3 x per week at her church.   The patient denies any new symptoms such as chest discomfort, neck or arm discomfort. There has been no new shortness of breath, PND or orthopnea. There have been no reported palpitations, presyncope or syncope.  She has chronically been SOB with activities but this is not changed from previous.  She has lost weight.  Allergies  Allergen Reactions  . Bacitracin-Polymyxin B   . Neomycin-Bacitracin Zn-Polymyx     Current Outpatient Prescriptions  Medication Sig Dispense Refill  . aspirin 81 MG tablet Take 162 mg by mouth daily.        . Calcium Carbonate-Vitamin D (CALTRATE 600+D) 600-400 MG-UNIT per tablet Take 4 tablets by mouth daily.        . Cholecalciferol (VITAMIN D3) 1000 UNITS CAPS Take by mouth 2 (two) times daily.      . diphenhydramine-acetaminophen (TYLENOL PM) 25-500 MG TABS Take 1 tablet by mouth at bedtime as needed.        Marland Kitchen FLUoxetine (PROZAC) 20 MG capsule Take 40 mg by mouth daily.        Marland Kitchen lisinopril (PRINIVIL,ZESTRIL) 20 MG tablet Take 20 mg by mouth daily.      . metoprolol (LOPRESSOR) 50 MG tablet TAKE TWO TABLETS BY MOUTH EVERY DAY  180 tablet  3  . Multiple Vitamin (MULTIVITAMIN) tablet Take 1 tablet by mouth daily.        . rosuvastatin (CRESTOR) 40 MG tablet Take 20 mg by mouth daily.       No current facility-administered medications for this visit.    Past Medical History  Diagnosis Date  . Hypertension   . Hyperlipidemia   . 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  . Depression     History   . Nodule of right lung 01/2002    CT SCAN.  Three nodules were noted on CT in 2001.  Marland Kitchen Shortness of breath     Past Surgical History  Procedure Laterality Date  . Coronary artery bypass graft  2005    (saphenous vein graft to the  left anterior descending, saphenous  vein graft to the ramus intermedius     saphenous vein graft to the right coronary artery  . Re-excision prepatellar bursa      Left Knee  . Hammer toe surgery      Right second toe  . Wrist surgery      Right    ROS: As stated in the HPI and negative for all other systems.  PHYSICAL EXAM BP 173/80  Pulse 51  Ht 5\' 1"  (1.549 m)  Wt 209 lb (94.802 kg)  BMI 39.51 kg/m2  SpO2 94% GENERAL:  Well appearing NECK:  No jugular venous distention, waveform within normal limits, carotid upstroke brisk and symmetric, no bruits, no thyromegaly LUNGS:  Clear to auscultation bilaterally BACK:  No CVA tenderness CHEST:  Well healed sternotomy scar. HEART:  PMI not displaced or sustained,S1 and S2 within normal limits, no S3, no S4, no clicks, no rubs, no murmurs ABD:  Flat, positive bowel sounds normal in frequency in pitch, no bruits, no rebound, no guarding, no midline pulsatile mass, no hepatomegaly, no splenomegaly, obese EXT:  2 plus pulses throughout, no edema, no cyanosis no clubbing   EKG:  Sinus rhythm, rate 53, axis within normal  limits, intervals within normal limits, no acute ST-T wave changes.  12/23/2012  ASSESSMENT AND PLAN  CAD:  The patient has no new sypmtoms since her stress test last year.  No further cardiovascular testing is indicated.  We will continue with aggressive risk reduction and meds as listed.  HYPERLIPIDEMIA:  She reports that this is well controlled. I will defer to Bedelia Person, MD  HTN:  Her blood pressure is elevated today.  However, she does not think that it is elevated at home.  I have instructed the patient to record a blood pressure diary and recording this. This will be presented for my review and pending these results I will make further suggestions about changes in therapy for optimal blood pressure control.  OBESITY:  I congratulated her on her weight loss and I encourage more of the same.

## 2012-12-23 NOTE — Patient Instructions (Addendum)

## 2013-04-28 ENCOUNTER — Other Ambulatory Visit: Payer: Self-pay | Admitting: *Deleted

## 2013-04-28 MED ORDER — ROSUVASTATIN CALCIUM 40 MG PO TABS: 40.0000 mg | ORAL_TABLET | Freq: Every day | ORAL | Status: AC

## 2013-12-29 ENCOUNTER — Encounter: Payer: Self-pay | Admitting: Cardiology

## 2013-12-29 ENCOUNTER — Ambulatory Visit (INDEPENDENT_AMBULATORY_CARE_PROVIDER_SITE_OTHER): Payer: Medicare Other | Admitting: Cardiology

## 2013-12-29 VITALS — BP 180/88 | HR 85 | Ht 61.0 in | Wt 209.0 lb

## 2013-12-29 DIAGNOSIS — I2510 Atherosclerotic heart disease of native coronary artery without angina pectoris: Secondary | ICD-10-CM

## 2013-12-29 DIAGNOSIS — I10 Essential (primary) hypertension: Secondary | ICD-10-CM

## 2013-12-29 DIAGNOSIS — R0989 Other specified symptoms and signs involving the circulatory and respiratory systems: Secondary | ICD-10-CM

## 2013-12-29 MED ORDER — METOPROLOL SUCCINATE ER 50 MG PO TB24: 50.0000 mg | ORAL_TABLET | Freq: Every day | ORAL | Status: DC

## 2013-12-29 NOTE — Patient Instructions (Signed)
Your physician has requested that you have a carotid duplex. This test is an ultrasound of the carotid arteries in your neck. It looks at blood flow through these arteries that supply the brain with blood. Allow one hour for this exam. There are no restrictions or special instructions. Office will contact with results via phone or letter.    Stop Lopressor Change to Toprol XL 50mg  daily - new sent to pharm  Continue all other medications.    Your physician has requested that you regularly monitor and record your blood pressure readings at home. Please take approximately 1-2 hours after your medication.  Return readings to office for MD review.  Your physician wants you to follow up in:  1 year.  You will receive a reminder letter in the mail one-two months in advance.  If you don't receive a letter, please call our office to schedule the follow up appointment

## 2013-12-29 NOTE — Progress Notes (Signed)
Clinical Summary [name] is a 73 y.o.female last seen by Dr Antoine PocheHochrein, this is our first visit together. She is seen for the following medical problems.   1. CAD -prior CABG in 2005 (SVG-LAD, SVG-ramus, SVG-RCA) - LVEF 10/2008 LVEF 50-55% - denies any chest pain, reports stable chronic DOE that is unchanged.   2. HTN - does not check regularly - compliant with meds, has not taken bp meds yet today. - lopressor 50mg  bid causes fatigue, now taking 100mg  at night.   3. Hyperlipidemia - compliant with statin - no recent panel in our system, reports recent blood work a few months ago with pcp    Past Medical History  Diagnosis Date  . Hypertension   . Hyperlipidemia   . 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  . Depression     History   . Nodule of right lung 01/2002    CT SCAN.  Three nodules were noted on CT in 2001.  Annette Cooper. Shortness of breath      Allergies  Allergen Reactions  . Bacitracin-Polymyxin B   . Neomycin-Bacitracin Zn-Polymyx      Current Outpatient Prescriptions  Medication Sig Dispense Refill  . aspirin 81 MG tablet Take 162 mg by mouth daily.        . Calcium Carbonate-Vitamin D (CALTRATE 600+D) 600-400 MG-UNIT per tablet Take 4 tablets by mouth daily.        . Cholecalciferol (VITAMIN D3) 1000 UNITS CAPS Take by mouth 2 (two) times daily.      . diphenhydramine-acetaminophen (TYLENOL PM) 25-500 MG TABS Take 1 tablet by mouth at bedtime as needed.        Annette Cooper. FLUoxetine (PROZAC) 20 MG capsule Take 40 mg by mouth daily.        Annette Cooper. lisinopril (PRINIVIL,ZESTRIL) 20 MG tablet Take 20 mg by mouth daily.      . metoprolol (LOPRESSOR) 50 MG tablet TAKE TWO TABLETS BY MOUTH EVERY DAY  180 tablet  3  . Multiple Vitamin (MULTIVITAMIN) tablet Take 1 tablet by mouth daily.        . rosuvastatin (CRESTOR) 40 MG tablet Take 1 tablet (40 mg total) by mouth daily.  90 tablet  3   No current facility-administered medications for this  visit.     Past Surgical History  Procedure Laterality Date  . Coronary artery bypass graft  2005    (saphenous vein graft to the  left anterior descending, saphenous vein graft to the ramus intermedius     saphenous vein graft to the right coronary artery  . Re-excision prepatellar bursa      Left Knee  . Hammer toe surgery      Right second toe  . Wrist surgery      Right     Allergies  Allergen Reactions  . Bacitracin-Polymyxin B   . Neomycin-Bacitracin Zn-Polymyx       No family history on file.   Social History [name] reports that she quit smoking about 10 years ago. Her smoking use included Cigarettes. She has a 50 pack-year smoking history. She does not have any smokeless tobacco history on file. [name] reports that she does not drink alcohol.   Review of Systems CONSTITUTIONAL: No weight loss, fever, chills, weakness or fatigue.  HEENT: Eyes: No visual loss, blurred vision, double vision or yellow sclerae.No hearing loss, sneezing, congestion, runny nose or sore throat.  SKIN: No rash or itching.  CARDIOVASCULAR: per HPI RESPIRATORY: +SOB GASTROINTESTINAL: No anorexia, nausea, vomiting or diarrhea. No abdominal pain or blood.  GENITOURINARY: No burning on urination, no polyuria NEUROLOGICAL: No headache, dizziness, syncope, paralysis, ataxia, numbness or tingling in the extremities. No change in bowel or bladder control.  MUSCULOSKELETAL: No muscle, back pain, joint pain or stiffness.  LYMPHATICS: No enlarged nodes. No history of splenectomy.  PSYCHIATRIC: No history of depression or anxiety.  ENDOCRINOLOGIC: No reports of sweating, cold or heat intolerance. No polyuria or polydipsia.  Annette Cooper.   Physical Examination p 85 bp 180/90 Wt 209 lbs BMI 40 Gen: resting comfortably, no acute distress HEENT: no scleral icterus, pupils equal round and reactive, no palptable cervical adenopathy,  CV: RRR, no m/r/g, no JVD, bilateral carotid bruits Resp: Clear to  auscultation bilaterally GI: abdomen is soft, non-tender, non-distended, normal bowel sounds, no hepatosplenomegaly MSK: extremities are warm, no edema.  Skin: warm, no rash Neuro:  no focal deficits Psych: appropriate affect   Diagnostic Studies 10/2012 Echo LVEF 50-55%, grade II diastolic dysfunction    Assessment and Plan  1. CAD - no current symptoms, continue risk factor modification and secondary preventoin  2. HTN - elevated in clinic. She reports a history of white coat HTN. She also has not taken her bp meds yet today - she will keep bp log and give us the results in 2 weeks - change her lopressor that she is only taking once a day to Toprol XL 50mg  daily. Lopressor at 50mg  bid previously caused significant fatigue, we will see how she tolerates Toprol XL  3. Hyperlipidemia - request most recent panel from pcp  4. Carotid bruits - check carotid US  F/u 1 year  Antoine PocheJonathan F. Joanell Cressler, M.D., F.A.C.C.

## 2013-12-30 NOTE — Addendum Note (Signed)
Addended by: Eustace MooreANDERSON, LYDIA M on: 12/30/2013 12:11 PM   Modules accepted: Orders

## 2014-01-22 ENCOUNTER — Encounter (INDEPENDENT_AMBULATORY_CARE_PROVIDER_SITE_OTHER): Payer: Medicare Other

## 2014-01-22 DIAGNOSIS — R0989 Other specified symptoms and signs involving the circulatory and respiratory systems: Secondary | ICD-10-CM

## 2014-01-22 DIAGNOSIS — I6529 Occlusion and stenosis of unspecified carotid artery: Secondary | ICD-10-CM

## 2014-01-23 ENCOUNTER — Telehealth: Payer: Self-pay | Admitting: *Deleted

## 2014-01-23 DIAGNOSIS — R9389 Abnormal findings on diagnostic imaging of other specified body structures: Secondary | ICD-10-CM

## 2014-01-23 NOTE — Telephone Encounter (Signed)
Patient aware.  Ref[facility] be put in & [facility]([place]).

## 2014-01-23 NOTE — Telephone Encounter (Signed)
Message copied by Lesle ChrisHILL, ANGELA G on Fri Jan 23, 2014  3:44 PM ------      Message from: [place] VillageBRANCH, JONATHAN F      Created: Thu Jan 22, 2014  3:00 PM       Please contact patient and let her know that the US of her neck shows significant blockages, mainly on the left side. I would like her to get into [place] [facility] for [place] fairly soon.                  Dominga FerryJ Branch MD ------

## 2014-01-27 ENCOUNTER — Other Ambulatory Visit: Payer: Self-pay | Admitting: *Deleted

## 2014-01-27 DIAGNOSIS — I6529 Occlusion and stenosis of unspecified carotid artery: Secondary | ICD-10-CM

## 2014-02-03 ENCOUNTER — Encounter: Payer: Self-pay | Admitting: Vascular Surgery

## 2014-02-04 ENCOUNTER — Ambulatory Visit (INDEPENDENT_AMBULATORY_CARE_PROVIDER_SITE_OTHER): Payer: Medicare Other | Admitting: Vascular Surgery

## 2014-02-04 ENCOUNTER — Encounter: Payer: Self-pay | Admitting: Vascular Surgery

## 2014-02-04 ENCOUNTER — Ambulatory Visit (HOSPITAL_COMMUNITY)
Admission: RE | Admit: 2014-02-04 | Discharge: 2014-02-04 | Disposition: A | Discharge status: Home / Self Care | Payer: Medicare Other | Source: Ambulatory Visit | Attending: Vascular Surgery | Admitting: Vascular Surgery

## 2014-02-04 VITALS — BP 156/64 | HR 57 | Ht 61.0 in | Wt 211.4 lb

## 2014-02-04 DIAGNOSIS — I6529 Occlusion and stenosis of unspecified carotid artery: Secondary | ICD-10-CM | POA: Insufficient documentation

## 2014-02-04 NOTE — Progress Notes (Signed)
[facility] [place] HISTORY AND PHYSICAL   CC:  Carotid artery stenosis  Annette Cooper, Caren T, MD  HPI: This is a 73 y.o. female who presented to her cardiologist for a routine checkup.  At that appointment, he heard a left carotid bruit and obtained a carotid duplex, which revealed 80-99% stenosis on the left and 40-59% stenosis on the right.  She has been asymptomatic and denies amaurosis fugax, weakness or numbness of either side of the body as well as speech difficulties.  She states that every couple of months, she will see what she describes as a lightening bolt in the center of her vision that can last up to 20 minutes, but does not obstruct her vision.  This is bilateral.  She is right handed.  She is on a statin for her cholesterol.  She is on a beta blocker and ACEI for her hypertension and she takes a daily baby aspirin.    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  She also quit smoking 10 years ago.  She walks 3x weekly at the church.  She presents for further recommendations for her carotid artery stenosis.  Past Medical History  Diagnosis Date  . Hypertension   . Hyperlipidemia   . 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  . Depression     History   . Nodule of right lung 01/2002    CT SCAN.  Three nodules were noted on CT in 2001.  Marland Kitchen. Shortness of breath   . Peripheral vascular disease   . CAD (coronary artery disease)    Past Surgical History  Procedure Laterality Date  . Coronary artery bypass graft  2005    (saphenous vein graft to the  left anterior descending, saphenous vein graft to the ramus intermedius     saphenous vein graft to the right coronary artery  . Re-excision prepatellar bursa      Left Knee  . Hammer toe surgery      Right second toe  . Wrist surgery      Right  . Tubal ligation      Allergies  Allergen Reactions  . Bacitracin-Polymyxin B   .  Neomycin-Bacitracin Zn-Polymyx   . Polysorbate Rash and Swelling    Polysporins, etc    Current Outpatient Prescriptions  Medication Sig Dispense Refill  . aspirin 81 MG tablet Take 162 mg by mouth daily.        . Calcium Carbonate-Vitamin D (CALTRATE 600+D) 600-400 MG-UNIT per tablet Take 4 tablets by mouth daily.        . Cholecalciferol (VITAMIN D3) 1000 UNITS CAPS Take by mouth 2 (two) times daily.      . diphenhydramine-acetaminophen (TYLENOL PM) 25-500 MG TABS Take 1 tablet by mouth at bedtime as needed.        Marland Kitchen. FLUoxetine (PROZAC) 20 MG capsule Take 40 mg by mouth daily.        Marland Kitchen. lisinopril (PRINIVIL,ZESTRIL) 20 MG tablet Take 20 mg by mouth daily.      . metoprolol succinate (TOPROL-XL) 50 MG 24 hr tablet Take 1 tablet (50 mg total) by mouth daily.  30 tablet  6  . Multiple Vitamin (MULTIVITAMIN) tablet Take 1 tablet by mouth daily.        . rosuvastatin (CRESTOR) 40 MG tablet Take 1 tablet (40 mg total) by mouth daily.  90 tablet  3  No current facility-administered medications for this visit.    Family History  Problem Relation Age of Onset  . Diabetes Mother   . Hyperlipidemia Mother   . Hypertension Mother   . Peripheral vascular disease Mother     amputation  . Diabetes Father   . Heart disease Father     beofre age 46  . Hyperlipidemia Father   . Hypertension Father   . Heart attack Father   . Diabetes Sister   . Hyperlipidemia Sister   . Hypertension Sister   . Diabetes Brother   . Heart disease Brother     before age 83  . Hyperlipidemia Brother   . Hypertension Brother   . Peripheral vascular disease Brother     History   Social History  . Marital Status: Married    Spouse Name: JAMES    Number of Children: N/A  . Years of Education: N/A   Occupational History  . RETIRED    Social History Main Topics  . Smoking status: Former Smoker -- 1.00 packs/day for 50 years    Types: Cigarettes    Quit date: 08/13/2003  . Smokeless tobacco: Not on  file  . Alcohol Use: No  . Drug Use: No  . Sexual Activity: Not on file   Other Topics Concern  . Not on file   Social History Narrative  . No narrative on file     ROS: [x]  Positive   [ ]  Negative   [ ]  All sytems reviewed and are negative  Cardiovascular: []  chest pain/pressure []  palpitations []  SOB lying flat []  DOE []  pain in legs while walking []  pain in feet when lying flat []  hx of DVT []  hx of phlebitis []  swelling in legs []  varicose veins  Pulmonary: []  productive cough []  asthma []  wheezing  Neurologic: []  weakness in []  arms []  legs []  numbness in []  arms []  legs [] difficulty speaking or slurred speech []  temporary loss of vision in one eye []  dizziness  Hematologic: []  bleeding problems []  problems with blood clotting easily  GI []  vomiting blood []  blood in stool  GU: []  burning with urination []  blood in urine  Psychiatric: []  hx of major depression  Integumentary: []  rashes []  ulcers  Constitutional: []  fever []  chills   PHYSICAL EXAMINATION:  Filed Vitals:   02/04/14 1531  BP: 156/64  Pulse:    Body mass index is 39.96 kg/(m^2).  General:  WDWN in NAD Gait: Not observed HENT: WNL, normocephalic Eyes: Pupils equal Pulmonary: normal non-labored breathing , without Rales, rhonchi,  wheezing Cardiac: RRR, without  Murmurs, rubs or gallops; with faint left carotid bruit Abdomen: soft, NT, no masses Skin: without rashes, without ulcers  Vascular Exam/Pulses:  Right Left  Radial 1+ (weak) 2+ (normal)  Ulnar 2+ (normal) 2+ (normal)  Femoral Unable to palpate Unable to palpate  Popliteal Unable to palpate Unable to palpate  DP 2+ (normal) 2+ (normal)    Extremities: without ischemic changes, without Gangrene , without cellulitis; without open wounds;  Musculoskeletal: no muscle wasting or atrophy  Neurologic: A&O X 3; Appropriate Affect ; SENSATION: normal; MOTOR FUNCTION:  moving all extremities equally. Speech is  fluent/normal   Non-Invasive Vascular Imaging:    80-99% stenosis of the left carotid artery 40-59% stenosis of the right carotid artery  The location of the stenosis is at the ICA only She does have a normal ICA past the stenosis Anatomy is within normal limits The bifurcation is mid (  hyoid notch)  Pt meds includes: Statin:  Yes.   Beta Blocker:  Yes.   Aspirin:  Yes.   ACEI:  Yes.   ARB:  No. Other Antiplatelet/Anticoagulant:  No.   ASSESSMENT/PLAN:: 73 y.o. female with severe asymptomatic left carotid artery stenosis in the range of 80-99% stenosis.   -Will proceed with left CEA on 02/12/14.  She will continue her baby aspirin.   Doreatha Massed, PA-C [facility] [phone]  Clinic MD:  Pt seen and examined in conjunction with Dr. Edilia Bo  Agree with above.  The patient has an asymptomatic greater than 80% left carotid stenosis. Peak systolic velocity 632 cm/s with end-diastolic velocity of 179 cm/s. I've recommended left carotid endarterectomy. I have reviewed the indications for carotid endarterectomy, that is to lower the risk of future stroke. I have also reviewed the potential complications of surgery, including but not limited to: bleeding, stroke (perioperative risk 1-2%), MI, nerve injury of other unpredictable medical problems. All of the patients questions were answered and they are agreeable to proceed with surgery. Her surgery is scheduled for 02/12/2014.  Waverly Ferrari, MD, FACS Beeper [phone] 02/04/2014

## 2014-02-05 ENCOUNTER — Other Ambulatory Visit: Payer: Self-pay

## 2014-02-05 ENCOUNTER — Encounter (HOSPITAL_COMMUNITY): Payer: Self-pay | Admitting: Pharmacy Technician

## 2014-02-05 NOTE — Pre-Procedure Instructions (Signed)
Denzil Magnusonudrey A Quale  02/05/2014   Your procedure is scheduled on:  Thursday, August 6th  [facility]mitting at 630 AM.  Call this number if you have problems the morning of surgery: [phone]   Remember:   Do not eat food or drink liquids after midnight.   Take these medicines the morning of surgery with A SIP OF WATER: toprol, prozac, eye drops as needed   Do not wear jewelry, make-up or nail polish.  Do not wear lotions, powders, or perfumes. You may wear deodorant.  Do not shave 48 hours prior to surgery. Men may shave face and neck.  Do not bring valuables to the hospital.  [facility] is not responsible  for any belongings or valuables.               Contacts, dentures or bridgework may not be worn into surgery.  Leave suitcase in the car. After surgery it may be brought to your room.  For patients admitted to the hospital, discharge time is determined by your treatment team.          Please read over the following fact sheets that you were given: Pain Booklet, Coughing and Deep Breathing, Blood Transfusion Information, MRSA Information and Surgical Site Infection Prevention [facility] - Preparing for Surgery  Before surgery, you can play an important role.  Because skin is not sterile, your skin needs to be as free of germs as possible.  You can reduce the number of germs on you skin by washing with CHG (chlorahexidine gluconate) soap before surgery.  CHG is an antiseptic cleaner which kills germs and bonds with the skin to continue killing germs even after washing.  Please DO NOT use if you have an allergy to CHG or antibacterial soaps.  If your skin becomes reddened/irritated stop using the CHG and inform your nurse when you arrive at Short Stay.  Do not shave (including legs and underarms) for at least 48 hours prior to the first CHG shower.  You may shave your face.  Please follow these instructions carefully:   1.  Shower with CHG Soap the night  before surgery and the morning of Surgery.  2.  If you choose to wash your hair, wash your hair first as usual with your normal shampoo.  3.  After you shampoo, rinse your hair and body thoroughly to remove the shampoo.  4.  Use CHG as you would any other liquid soap.  You can apply CHG directly to the skin and wash gently with scrungie or a clean washcloth.  5.  Apply the CHG Soap to your body ONLY FROM THE NECK DOWN.  Do not use on open wounds or open sores.  Avoid contact with your eyes, ears, mouth and genitals (private parts).  Wash genitals (private parts) with your normal soap.  6.  Wash thoroughly, paying special attention to the area where your surgery will be performed.  7.  Thoroughly rinse your body with warm water from the neck down.  8.  DO NOT shower/wash with your normal soap after using and rinsing off the CHG Soap.  9.  Pat yourself dry with a clean towel.            10.  Wear clean pajamas.            11.  Place clean sheets on your bed the night of your first shower and do not sleep with pets.  Day of Surgery  Do not apply any lotions/deoderants the morning of surgery.  Please wear clean clothes to the hospital/surgery center.

## 2014-02-06 ENCOUNTER — Ambulatory Visit (HOSPITAL_COMMUNITY)
Admission: RE | Admit: 2014-02-06 | Discharge: 2014-02-06 | Disposition: A | Discharge status: Home / Self Care | Payer: Medicare Other | Source: Ambulatory Visit | Attending: Vascular Surgery | Admitting: Vascular Surgery

## 2014-02-06 ENCOUNTER — Encounter (HOSPITAL_COMMUNITY): Payer: Self-pay

## 2014-02-06 ENCOUNTER — Encounter (HOSPITAL_COMMUNITY)
Admission: RE | Admit: 2014-02-06 | Discharge: 2014-02-06 | Disposition: A | Discharge status: Home / Self Care | Payer: Medicare Other | Source: Ambulatory Visit | Attending: Vascular Surgery | Admitting: Vascular Surgery

## 2014-02-06 DIAGNOSIS — Z951 Presence of aortocoronary bypass graft: Secondary | ICD-10-CM | POA: Diagnosis not present

## 2014-02-06 DIAGNOSIS — I10 Essential (primary) hypertension: Secondary | ICD-10-CM | POA: Diagnosis not present

## 2014-02-06 DIAGNOSIS — I517 Cardiomegaly: Secondary | ICD-10-CM | POA: Insufficient documentation

## 2014-02-06 HISTORY — DX: Unspecified osteoarthritis, unspecified site: M19.90

## 2014-02-06 LAB — TYPE AND SCREEN
ABO/RH(D): O POS
Antibody Screen: NEGATIVE

## 2014-02-06 LAB — URINALYSIS, ROUTINE W REFLEX MICROSCOPIC
BILIRUBIN URINE: NEGATIVE
Glucose, UA: NEGATIVE mg/dL
Hgb urine dipstick: NEGATIVE
Ketones, ur: NEGATIVE mg/dL
NITRITE: NEGATIVE
PH: 6.5 (ref 5.0–8.0)
Protein, ur: 30 mg/dL — AB
Specific Gravity, Urine: 1.021 (ref 1.005–1.030)
UROBILINOGEN UA: 1 mg/dL (ref 0.0–1.0)

## 2014-02-06 LAB — COMPREHENSIVE METABOLIC PANEL
ALT: 22 U/L (ref 0–35)
AST: 27 U/L (ref 0–37)
Albumin: 4.3 g/dL (ref 3.5–5.2)
Alkaline Phosphatase: 77 U/L (ref 39–117)
Anion gap: 16 — ABNORMAL HIGH (ref 5–15)
BUN: 14 mg/dL (ref 6–23)
CALCIUM: 9.4 mg/dL (ref 8.4–10.5)
CO2: 23 meq/L (ref 19–32)
CREATININE: 0.63 mg/dL (ref 0.50–1.10)
Chloride: 105 mEq/L (ref 96–112)
GFR, EST NON AFRICAN AMERICAN: 87 mL/min — AB (ref >90)
Glucose, Bld: 107 mg/dL — ABNORMAL HIGH (ref 70–99)
Potassium: 4.5 mEq/L (ref 3.7–5.3)
Sodium: 144 mEq/L (ref 137–147)
Total Bilirubin: 0.4 mg/dL (ref 0.3–1.2)
Total Protein: 7.9 g/dL (ref 6.0–8.3)

## 2014-02-06 LAB — CBC
HCT: 48.2 % — ABNORMAL HIGH (ref 36.0–46.0)
Hemoglobin: 15.8 g/dL — ABNORMAL HIGH (ref 12.0–15.0)
MCH: 31.2 pg (ref 26.0–34.0)
MCHC: 32.8 g/dL (ref 30.0–36.0)
MCV: 95.1 fL (ref 78.0–100.0)
Platelets: 229 10*3/uL (ref 150–400)
RBC: 5.07 MIL/uL (ref 3.87–5.11)
RDW: 13.7 % (ref 11.5–15.5)
WBC: 8.9 10*3/uL (ref 4.0–10.5)

## 2014-02-06 LAB — URINE MICROSCOPIC-ADD ON

## 2014-02-06 LAB — ABO/RH: ABO/RH(D): O POS

## 2014-02-06 LAB — PROTIME-INR
INR: 0.95 (ref 0.00–1.49)
PROTHROMBIN TIME: 12.7 s (ref 11.6–15.2)

## 2014-02-06 LAB — SURGICAL PCR SCREEN
MRSA, PCR: NEGATIVE
Staphylococcus aureus: NEGATIVE

## 2014-02-06 LAB — APTT: aPTT: 29 seconds (ref 24–37)

## 2014-02-06 MED ORDER — CHLORHEXIDINE GLUCONATE 4 % EX LIQD: 60.0000 mL | Freq: Once | CUTANEOUS | Status: DC

## 2014-02-07 LAB — URINALYSIS, ROUTINE W REFLEX MICROSCOPIC
APPearance: UNDETERMINED — AB
BILIRUBIN URINE: UNDETERMINED — AB
COLOR, URINE: UNDETERMINED — AB
GLUCOSE, UA: UNDETERMINED mg/dL — AB
Hgb urine dipstick: UNDETERMINED — AB
KETONES UR: UNDETERMINED mg/dL — AB
Leukocytes, UA: UNDETERMINED — AB
Nitrite: UNDETERMINED — AB
PH: UNDETERMINED (ref 5.0–8.0)
PROTEIN: UNDETERMINED mg/dL — AB
Specific Gravity, Urine: UNDETERMINED (ref 1.005–1.030)
Urobilinogen, UA: UNDETERMINED mg/dL (ref 0.0–1.0)

## 2014-02-09 ENCOUNTER — Telehealth: Payer: Self-pay | Admitting: *Deleted

## 2014-02-09 ENCOUNTER — Encounter (HOSPITAL_COMMUNITY): Payer: Medicare Other | Admitting: Vascular Surgery

## 2014-02-09 ENCOUNTER — Inpatient Hospital Stay (HOSPITAL_COMMUNITY): Payer: Medicare Other | Admitting: Certified Registered Nurse Anesthetist

## 2014-02-09 DIAGNOSIS — Z79899 Other long term (current) drug therapy: Secondary | ICD-10-CM

## 2014-02-09 DIAGNOSIS — I10 Essential (primary) hypertension: Secondary | ICD-10-CM

## 2014-02-09 NOTE — Progress Notes (Signed)
Anesthesia Chart Review:  Patient is a 73 year old female scheduled for left carotid endarterectomy on 02/12/14 by Dr. Edilia Boickson.  History includes former smoker, CAD s/p CABG '05 (SVG-LAD, SVG-ramus, SVG-RCA), HTN, HLD, COPD/emphysema, PAD, arthritis, right lung nodule by 2001 CT (not mentioned on 2005 CTA). Cardiologist is Dr. Dina RichJonathan Branch at [facility] in [place] who referred patient to [facility] for evaluation/treatment of her significant left ICA stenosis.  EKG on 12/29/13 showed NSrn, minimal voltage criteria for LVH, inferior infarct (age undetermined).   Nuclear stress test on 12/27/11 [facility] showed: Normal LV perfusion. Stress testing induced no chest pain symptoms and no ECG changes consistent with ischemia. Lexiscan global left ventricular systolic function was normal with EF 62%. There was normal wall motion.   He last cath was on 11/18/03 prior to her CABG.  Carotid duplex on 01/26/14 showed: 80-99% stenosis of the left carotid artery. 40-59% stenosis of the right carotid artery.  CXR on 02/06/14 showed: Cardiomegaly. Status post CABG. Chronic interstitial prominence without convincing pulmonary edema. No segmental infiltrate.   Preoperative labs noted.   Patient with recent cardiology follow-up with referral to [facility].  If no acute changes then I would anticipate that she can proceed as planned.  Velna Ochsllison Thayer Embleton, PA-C [facility] Short Stay [facility]/Anesthesiology Phone [phone] 02/09/2014 12:39 PM

## 2014-02-09 NOTE — Telephone Encounter (Signed)
Message copied by Lesle ChrisHILL, ANGELA G on Mon Feb 09, 2014  2:46 PM ------      Message from: [place]ANCH, JONATHAN F      Created: Tue Feb 03, 2014 11:59 AM       BP log reviewed, on average bp's are too high. Pleas increase lisinopril to 40mg  daily with BMET in 2 weeks. She should see me in clinic in 5 weeks or so with another bp log. Her heart rates are also on the low side, decrease Toprol XL to 25mg  daily. Please bring another bp log to our clinic visit, on a home piece of paper is fine                  Dominga FerryJ Branch MD ------

## 2014-02-11 MED ORDER — LISINOPRIL 20 MG PO TABS: 40.0000 mg | ORAL_TABLET | Freq: Every day | ORAL | Status: DC

## 2014-02-11 MED ORDER — METOPROLOL SUCCINATE ER 50 MG PO TB24: ORAL_TABLET | ORAL | Status: DC

## 2014-02-11 MED ORDER — DEXTROSE 5 % IV SOLN
1.5000 g | INTRAVENOUS | Status: DC
  Filled 2014-02-11: qty 1.5

## 2014-02-11 MED ORDER — SODIUM CHLORIDE 0.9 % IV SOLN: INTRAVENOUS | Status: DC

## 2014-02-11 NOTE — Telephone Encounter (Signed)
Patient notified.  States she having procedure done tomorrow.  States she takes these medications at night, so advised to make change starting tonight.  Will mail lab order to patient home.  Follow up OV scheduled for 03/23/2014 with Dr. Wyline MoodBranch.

## 2014-02-12 ENCOUNTER — Encounter (HOSPITAL_COMMUNITY): Payer: Self-pay | Admitting: *Deleted

## 2014-02-12 ENCOUNTER — Ambulatory Visit (HOSPITAL_COMMUNITY)
Admission: RE | Admit: 2014-02-12 | Discharge: 2014-02-12 | DRG: 068 | Disposition: A | Discharge status: Home / Self Care | Payer: Medicare Other | Source: Ambulatory Visit | Attending: Vascular Surgery | Admitting: Vascular Surgery

## 2014-02-12 ENCOUNTER — Encounter (HOSPITAL_COMMUNITY)
Admission: RE | Disposition: A | Discharge status: Home / Self Care | Payer: Self-pay | Source: Ambulatory Visit | Attending: Vascular Surgery

## 2014-02-12 DIAGNOSIS — I6529 Occlusion and stenosis of unspecified carotid artery: Secondary | ICD-10-CM | POA: Diagnosis present

## 2014-02-12 DIAGNOSIS — Z5309 Procedure and treatment not carried out because of other contraindication: Secondary | ICD-10-CM | POA: Diagnosis not present

## 2014-02-12 DIAGNOSIS — I10 Essential (primary) hypertension: Secondary | ICD-10-CM | POA: Diagnosis present

## 2014-02-12 SURGERY — ENDARTERECTOMY, CAROTID
Anesthesia: General | Site: Neck | Laterality: Left

## 2014-02-12 MED ORDER — FENTANYL CITRATE 0.05 MG/ML IJ SOLN
INTRAMUSCULAR | Status: AC
  Filled 2014-02-12: qty 5

## 2014-02-12 MED ORDER — ARTIFICIAL TEARS OP OINT
TOPICAL_OINTMENT | OPHTHALMIC | Status: AC
  Filled 2014-02-12: qty 3.5

## 2014-02-12 MED ORDER — ROCURONIUM BROMIDE 50 MG/5ML IV SOLN
INTRAVENOUS | Status: AC
  Filled 2014-02-12: qty 1

## 2014-02-12 MED ORDER — ONDANSETRON HCL 4 MG/2ML IJ SOLN
INTRAMUSCULAR | Status: AC
  Filled 2014-02-12: qty 2

## 2014-02-12 MED ORDER — PROPOFOL 10 MG/ML IV BOLUS
INTRAVENOUS | Status: AC
  Filled 2014-02-12: qty 20

## 2014-02-12 MED ORDER — EPHEDRINE SULFATE 50 MG/ML IJ SOLN
INTRAMUSCULAR | Status: AC
  Filled 2014-02-12: qty 1

## 2014-02-12 MED ORDER — SUCCINYLCHOLINE CHLORIDE 20 MG/ML IJ SOLN
INTRAMUSCULAR | Status: AC
  Filled 2014-02-12: qty 1

## 2014-02-12 MED ORDER — LACTATED RINGERS IV SOLN: INTRAVENOUS | Status: DC

## 2014-02-12 MED ORDER — PHENYLEPHRINE 40 MCG/ML (10ML) SYRINGE FOR IV PUSH (FOR BLOOD PRESSURE SUPPORT)
PREFILLED_SYRINGE | INTRAVENOUS | Status: AC
  Filled 2014-02-12: qty 10

## 2014-02-12 MED ORDER — SODIUM CHLORIDE 0.9 % IJ SOLN
INTRAMUSCULAR | Status: AC
  Filled 2014-02-12: qty 10

## 2014-02-12 MED ORDER — LIDOCAINE HCL (CARDIAC) 20 MG/ML IV SOLN
INTRAVENOUS | Status: AC
  Filled 2014-02-12: qty 10

## 2014-02-12 MED ORDER — MIDAZOLAM HCL 2 MG/2ML IJ SOLN
INTRAMUSCULAR | Status: AC
  Filled 2014-02-12: qty 2

## 2014-02-12 SURGICAL SUPPLY — 42 items
BAG DECANTER FOR FLEXI CONT (MISCELLANEOUS) ×3 IMPLANT
BLADE 10 SAFETY STRL DISP (BLADE) ×3 IMPLANT
CANISTER SUCTION 2500CC (MISCELLANEOUS) ×3 IMPLANT
CANNULA VESSEL 3MM 2 BLNT TIP (CANNULA) ×3 IMPLANT
CATH ROBINSON RED A/P 18FR (CATHETERS) ×3 IMPLANT
CLIP TI MEDIUM 24 (CLIP) ×3 IMPLANT
CLIP TI WIDE RED SMALL 24 (CLIP) ×3 IMPLANT
COVER SURGICAL LIGHT HANDLE (MISCELLANEOUS) ×3 IMPLANT
CRADLE DONUT ADULT HEAD (MISCELLANEOUS) ×3 IMPLANT
DERMABOND ADVANCED (GAUZE/BANDAGES/DRESSINGS) ×2
DERMABOND ADVANCED .7 DNX12 (GAUZE/BANDAGES/DRESSINGS) ×1 IMPLANT
DRAIN CHANNEL 15F RND FF W/TCR (WOUND CARE) IMPLANT
DRAPE WARM FLUID 44X44 (DRAPE) ×3 IMPLANT
ELECT REM PT RETURN 9FT ADLT (ELECTROSURGICAL) ×3
ELECTRODE REM PT RTRN 9FT ADLT (ELECTROSURGICAL) ×1 IMPLANT
EVACUATOR SILICONE 100CC (DRAIN) IMPLANT
GLOVE BIO SURGEON STRL SZ7.5 (GLOVE) ×3 IMPLANT
GLOVE BIOGEL PI IND STRL 8 (GLOVE) ×1 IMPLANT
GLOVE BIOGEL PI INDICATOR 8 (GLOVE) ×2
GOWN STRL REUS W/ TWL LRG LVL3 (GOWN DISPOSABLE) ×3 IMPLANT
GOWN STRL REUS W/TWL LRG LVL3 (GOWN DISPOSABLE) ×6
KIT BASIN OR (CUSTOM PROCEDURE TRAY) ×3 IMPLANT
KIT ROOM TURNOVER OR (KITS) ×3 IMPLANT
NEEDLE HYPO 25X1 1.5 SAFETY (NEEDLE) ×3 IMPLANT
NS IRRIG 1000ML POUR BTL (IV SOLUTION) ×6 IMPLANT
PACK CAROTID (CUSTOM PROCEDURE TRAY) ×3 IMPLANT
PAD ARMBOARD 7.5X6 YLW CONV (MISCELLANEOUS) ×6 IMPLANT
SHUNT CAROTID BYPASS 10 (VASCULAR PRODUCTS) IMPLANT
SHUNT CAROTID BYPASS 12FRX15.5 (VASCULAR PRODUCTS) IMPLANT
SPONGE SURGIFOAM ABS GEL 100 (HEMOSTASIS) IMPLANT
SUT PROLENE 6 0 BV (SUTURE) ×3 IMPLANT
SUT PROLENE 7 0 BV 1 (SUTURE) IMPLANT
SUT SILK 2 0 FS (SUTURE) IMPLANT
SUT SILK 3 0 TIES 17X18 (SUTURE)
SUT SILK 3-0 18XBRD TIE BLK (SUTURE) IMPLANT
SUT VIC AB 3-0 SH 27 (SUTURE) ×2
SUT VIC AB 3-0 SH 27X BRD (SUTURE) ×1 IMPLANT
SUT VICRYL 4-0 PS2 18IN ABS (SUTURE) ×3 IMPLANT
SYR CONTROL 10ML LL (SYRINGE) ×3 IMPLANT
TOWEL OR 17X24 6PK STRL BLUE (TOWEL DISPOSABLE) ×3 IMPLANT
TOWEL OR 17X26 10 PK STRL BLUE (TOWEL DISPOSABLE) ×3 IMPLANT
WATER STERILE IRR 1000ML POUR (IV SOLUTION) ×3 IMPLANT

## 2014-02-12 NOTE — Anesthesia Preprocedure Evaluation (Signed)
Anesthesia Evaluation  Patient identified by MRN, date of birth, ID band Patient awake    Reviewed: Allergy & Precautions, H&P , NPO status , Patient's Chart, lab work & pertinent test results  Airway Mallampati: II  Neck ROM: full    Dental   Pulmonary shortness of breath, COPDformer smoker,          Cardiovascular hypertension, + CAD, + CABG and + Peripheral Vascular Disease     Neuro/Psych Depression    GI/Hepatic   Endo/Other  Morbid obesity  Renal/GU      Musculoskeletal  (+) Arthritis -,   Abdominal   Peds  Hematology   Anesthesia Other Findings   Reproductive/Obstetrics                           Anesthesia Physical Anesthesia Plan  ASA: III  Anesthesia Plan: General   Post-op Pain Management:    Induction: Intravenous  Airway Management Planned: Oral ETT  Additional Equipment: Arterial line  Intra-op Plan:   Post-operative Plan: Extubation in OR  Informed Consent: I have reviewed the patients History and Physical, chart, labs and discussed the procedure including the risks, benefits and alternatives for the proposed anesthesia with the patient or authorized representative who has indicated his/her understanding and acceptance.     Plan Discussed with: CRNA, Anesthesiologist and Surgeon  Anesthesia Plan Comments:         Anesthesia Quick Evaluation

## 2014-02-12 NOTE — H&P (View-Only) (Signed)
[facility] [place] HISTORY AND PHYSICAL   CC:  Carotid artery stenosis  [name], Caren T, MD  HPI: This is a 73 y.o. female who presented to her cardiologist for a routine checkup.  At that appointment, he heard a left carotid bruit and obtained a carotid duplex, which revealed 80-99% stenosis on the left and 40-59% stenosis on the right.  She has been asymptomatic and denies amaurosis fugax, weakness or numbness of either side of the body as well as speech difficulties.  She states that every couple of months, she will see what she describes as a lightening bolt in the center of her vision that can last up to 20 minutes, but does not obstruct her vision.  This is bilateral.  She is right handed.  She is on a statin for her cholesterol.  She is on a beta blocker and ACEI for her hypertension and she takes a daily baby aspirin.    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  She also quit smoking 10 years ago.  She walks 3x weekly at the church.  She presents for further recommendations for her carotid artery stenosis.  Past Medical History  Diagnosis Date  . Hypertension   . Hyperlipidemia   . 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  . Depression     History   . Nodule of right lung 01/2002    CT SCAN.  Three nodules were noted on CT in 2001.  . Shortness of breath   . Peripheral vascular disease   . CAD (coronary artery disease)    Past Surgical History  Procedure Laterality Date  . Coronary artery bypass graft  2005    (saphenous vein graft to the  left anterior descending, saphenous vein graft to the ramus intermedius     saphenous vein graft to the right coronary artery  . Re-excision prepatellar bursa      Left Knee  . Hammer toe surgery      Right second toe  . Wrist surgery      Right  . Tubal ligation      Allergies  Allergen Reactions  . Bacitracin-Polymyxin B   .  Neomycin-Bacitracin Zn-Polymyx   . Polysorbate Rash and Swelling    Polysporins, etc    Current Outpatient Prescriptions  Medication Sig Dispense Refill  . aspirin 81 MG tablet Take 162 mg by mouth daily.        . Calcium Carbonate-Vitamin D (CALTRATE 600+D) 600-400 MG-UNIT per tablet Take 4 tablets by mouth daily.        . Cholecalciferol (VITAMIN D3) 1000 UNITS CAPS Take by mouth 2 (two) times daily.      . diphenhydramine-acetaminophen (TYLENOL PM) 25-500 MG TABS Take 1 tablet by mouth at bedtime as needed.        . FLUoxetine (PROZAC) 20 MG capsule Take 40 mg by mouth daily.        . lisinopril (PRINIVIL,ZESTRIL) 20 MG tablet Take 20 mg by mouth daily.      . metoprolol succinate (TOPROL-XL) 50 MG 24 hr tablet Take 1 tablet (50 mg total) by mouth daily.  30 tablet  6  . Multiple Vitamin (MULTIVITAMIN) tablet Take 1 tablet by mouth daily.        . rosuvastatin (CRESTOR) 40 MG tablet Take 1 tablet (40 mg total) by mouth daily.  90 tablet  3     No current facility-administered medications for this visit.    Family History  Problem Relation Age of Onset  . Diabetes Mother   . Hyperlipidemia Mother   . Hypertension Mother   . Peripheral vascular disease Mother     amputation  . Diabetes Father   . Heart disease Father     beofre age 60  . Hyperlipidemia Father   . Hypertension Father   . Heart attack Father   . Diabetes Sister   . Hyperlipidemia Sister   . Hypertension Sister   . Diabetes Brother   . Heart disease Brother     before age 60  . Hyperlipidemia Brother   . Hypertension Brother   . Peripheral vascular disease Brother     History   Social History  . Marital Status: Married    Spouse Name: JAMES    Number of Children: N/A  . Years of Education: N/A   Occupational History  . RETIRED    Social History Main Topics  . Smoking status: Former Smoker -- 1.00 packs/day for 50 years    Types: Cigarettes    Quit date: 08/13/2003  . Smokeless tobacco: Not on  file  . Alcohol Use: No  . Drug Use: No  . Sexual Activity: Not on file   Other Topics Concern  . Not on file   Social History Narrative  . No narrative on file     ROS: [x] Positive   [ ] Negative   [ ] All sytems reviewed and are negative  Cardiovascular: [] chest pain/pressure [] palpitations [] SOB lying flat [] DOE [] pain in legs while walking [] pain in feet when lying flat [] hx of DVT [] hx of phlebitis [] swelling in legs [] varicose veins  Pulmonary: [] productive cough [] asthma [] wheezing  Neurologic: [] weakness in [] arms [] legs [] numbness in [] arms [] legs []difficulty speaking or slurred speech [] temporary loss of vision in one eye [] dizziness  Hematologic: [] bleeding problems [] problems with blood clotting easily  GI [] vomiting blood [] blood in stool  GU: [] burning with urination [] blood in urine  Psychiatric: [] hx of major depression  Integumentary: [] rashes [] ulcers  Constitutional: [] fever [] chills   PHYSICAL EXAMINATION:  Filed Vitals:   02/04/14 1531  BP: 156/64  Pulse:    Body mass index is 39.96 kg/(m^2).  General:  WDWN in NAD Gait: Not observed HENT: WNL, normocephalic Eyes: Pupils equal Pulmonary: normal non-labored breathing , without Rales, rhonchi,  wheezing Cardiac: RRR, without  Murmurs, rubs or gallops; with faint left carotid bruit Abdomen: soft, NT, no masses Skin: without rashes, without ulcers  Vascular Exam/Pulses:  Right Left  Radial 1+ (weak) 2+ (normal)  Ulnar 2+ (normal) 2+ (normal)  Femoral Unable to palpate Unable to palpate  Popliteal Unable to palpate Unable to palpate  DP 2+ (normal) 2+ (normal)    Extremities: without ischemic changes, without Gangrene , without cellulitis; without open wounds;  Musculoskeletal: no muscle wasting or atrophy  Neurologic: A&O X 3; Appropriate Affect ; SENSATION: normal; MOTOR FUNCTION:  moving all extremities equally. Speech is  fluent/normal   Non-Invasive Vascular Imaging:    80-99% stenosis of the left carotid artery 40-59% stenosis of the right carotid artery  The location of the stenosis is at the ICA only She does have a normal ICA past the stenosis Anatomy is within normal limits The bifurcation is mid (  hyoid notch)  Pt meds includes: Statin:  Yes.   Beta Blocker:  Yes.   Aspirin:  Yes.   ACEI:  Yes.   ARB:  No. Other Antiplatelet/Anticoagulant:  No.   ASSESSMENT/PLAN:: 73 y.o. female with severe asymptomatic left carotid artery stenosis in the range of 80-99% stenosis.   -Will proceed with left CEA on 02/12/14.  She will continue her baby aspirin.   Samantha Rhyne, PA-C [facility] [phone]  Clinic MD:  Pt seen and examined in conjunction with Dr. Dontrey Snellgrove  Agree with above.  The patient has an asymptomatic greater than 80% left carotid stenosis. Peak systolic velocity 632 cm/s with end-diastolic velocity of 179 cm/s. I've recommended left carotid endarterectomy. I have reviewed the indications for carotid endarterectomy, that is to lower the risk of future stroke. I have also reviewed the potential complications of surgery, including but not limited to: bleeding, stroke (perioperative risk 1-2%), MI, nerve injury of other unpredictable medical problems. All of the patients questions were answered and they are agreeable to proceed with surgery. Her surgery is scheduled for 02/12/2014.  Commodore Bellew, MD, FACS Beeper [phone] 02/04/2014  

## 2014-02-12 NOTE — Interval H&P Note (Signed)
History and Physical Interval Note:  02/12/2014 6:52 AM  Annette MagnusonAudrey A Friedel  has presented today for surgery, with the diagnosis of Left Internal Carotid Artery Stenosis  The various methods of treatment have been discussed with the patient and family. After consideration of risks, benefits and other options for treatment, the patient has consented to  Procedure(s): ENDARTERECTOMY CAROTID-LEFT (Left) as a surgical intervention .  The patient's history has been reviewed, patient examined, no change in status, stable for surgery.  I have reviewed the patient's chart and labs.  Questions were answered to the patient's satisfaction.     Tali Coster S

## 2014-02-12 NOTE — Progress Notes (Signed)
Surgery cancelled due to elevated blood pressure.

## 2014-02-13 ENCOUNTER — Ambulatory Visit (INDEPENDENT_AMBULATORY_CARE_PROVIDER_SITE_OTHER): Payer: Medicare Other | Admitting: Cardiology

## 2014-02-13 ENCOUNTER — Encounter: Payer: Self-pay | Admitting: Cardiology

## 2014-02-13 ENCOUNTER — Other Ambulatory Visit: Payer: Self-pay | Admitting: *Deleted

## 2014-02-13 VITALS — BP 244/124 | HR 95 | Ht 61.0 in | Wt 209.0 lb

## 2014-02-13 DIAGNOSIS — I10 Essential (primary) hypertension: Secondary | ICD-10-CM

## 2014-02-13 DIAGNOSIS — E785 Hyperlipidemia, unspecified: Secondary | ICD-10-CM

## 2014-02-13 DIAGNOSIS — I6529 Occlusion and stenosis of unspecified carotid artery: Secondary | ICD-10-CM

## 2014-02-13 MED ORDER — CHLORTHALIDONE 25 MG PO TABS: 25.0000 mg | ORAL_TABLET | Freq: Every day | ORAL | Status: DC

## 2014-02-13 NOTE — Progress Notes (Signed)
Clinical Summary Ms. Schild is a 73 y.o.female seen today for follow up. This is a focused visit on her history of HTN.  1. HTN   - since last visit she had submitted a bp log 02/09/2014 with above goal blood pressures, lisinopril was increased to 40mg  daily. We also decreased her toprol XL to 25mg  daily for some mildly decreased heart rates.  - she presented to [facility] for CEA, however surgery cancelled due to severely elevated bp's with SBP >200. - takes medicine typically at night, took night before surgery at 8pm, including the increased lisionpril to 40mg  and Toprol decreased to 25mg  daily - bp log from yesterday AM and today PM 150/70s     2. Carotid stenosis - patient went for CEA yesterday, noted to have severely elevated bp's with SBP>200, she was asymptomatic. The case was cancelled and our office was contacted to have her f/u for bp control   Past Medical History  Diagnosis Date  . Hypertension   . Hyperlipidemia   . 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  . Depression     History   . Nodule of right lung 01/2002    CT SCAN.  Three nodules were noted on CT in 2001.  Marland Kitchen Shortness of breath   . Peripheral vascular disease   . CAD (coronary artery disease)   . Arthritis     ddd     Allergies  Allergen Reactions  . Bacitracin-Polymyxin B Swelling and Rash  . Neomycin-Bacitracin Zn-Polymyx Swelling and Rash  . Polysorbate Swelling and Rash    Polysporins, etc     Current Outpatient Prescriptions  Medication Sig Dispense Refill  . aspirin 81 MG tablet Take 81 mg by mouth daily.       . Calcium Carbonate-Vitamin D (CALTRATE 600+D) 600-400 MG-UNIT per tablet Take 3 tablets by mouth daily.       . diphenhydramine-acetaminophen (TYLENOL PM) 25-500 MG TABS Take 1 tablet by mouth at bedtime as needed (for sleep).       Marland Kitchen FLUoxetine (PROZAC) 20 MG capsule Take 40 mg by mouth daily.       Marland Kitchen ketotifen (ALAWAY) 0.025 % ophthalmic  solution Place 1 drop into the left eye daily as needed (for allergies).      Marland Kitchen lisinopril (PRINIVIL,ZESTRIL) 20 MG tablet Take 2 tablets (40 mg total) by mouth daily.      . metoprolol succinate (TOPROL-XL) 50 MG 24 hr tablet Take 1/2 tablet daily      . Multiple Vitamin (MULTIVITAMIN) tablet Take 1 tablet by mouth daily.        . rosuvastatin (CRESTOR) 40 MG tablet Take 1 tablet (40 mg total) by mouth daily.  90 tablet  3   No current facility-administered medications for this visit.     Past Surgical History  Procedure Laterality Date  . Coronary artery bypass graft  2005    (saphenous vein graft to the  left anterior descending, saphenous vein graft to the ramus intermedius     saphenous vein graft to the right coronary artery  . Re-excision prepatellar bursa      Left Knee  . Hammer toe surgery      Right second toe  . Wrist surgery      Right  . Tubal ligation       Allergies  Allergen Reactions  . Bacitracin-Polymyxin B Swelling and Rash  . Neomycin-Bacitracin Zn-Polymyx Swelling and Rash  .  Polysorbate Swelling and Rash    Polysporins, etc      Family History  Problem Relation Age of Onset  . Diabetes Mother   . Hyperlipidemia Mother   . Hypertension Mother   . Peripheral vascular disease Mother     amputation  . Diabetes Father   . Heart disease Father     beofre age 73  . Hyperlipidemia Father   . Hypertension Father   . Heart attack Father   . Diabetes Sister   . Hyperlipidemia Sister   . Hypertension Sister   . Diabetes Brother   . Heart disease Brother     before age 960  . Hyperlipidemia Brother   . Hypertension Brother   . Peripheral vascular disease Brother      Social History Ms. Sindy GuadeloupeGonyer reports that she quit smoking about 10 years ago. Her smoking use included Cigarettes. She has a 50 pack-year smoking history. She does not have any smokeless tobacco history on file. Ms. Sindy GuadeloupeGonyer reports that she does not drink alcohol.   Review of  Systems CONSTITUTIONAL: No weight loss, fever, chills, weakness or fatigue.  HEENT: Eyes: No visual loss, blurred vision, double vision or yellow sclerae.No hearing loss, sneezing, congestion, runny nose or sore throat.  SKIN: No rash or itching.  CARDIOVASCULAR:  RESPIRATORY: No shortness of breath, cough or sputum.  GASTROINTESTINAL: No anorexia, nausea, vomiting or diarrhea. No abdominal pain or blood.  GENITOURINARY: No burning on urination, no polyuria NEUROLOGICAL: No headache, dizziness, syncope, paralysis, ataxia, numbness or tingling in the extremities. No change in bowel or bladder control.  MUSCULOSKELETAL: No muscle, back pain, joint pain or stiffness.  LYMPHATICS: No enlarged nodes. No history of splenectomy.  PSYCHIATRIC: No history of depression or anxiety.  ENDOCRINOLOGIC: No reports of sweating, cold or heat intolerance. No polyuria or polydipsia.  Marland Kitchen.   Physical Examination There were no vitals filed for this visit. There were no vitals filed for this visit.  Gen: resting comfortably, no acute distress HEENT: no scleral icterus, pupils equal round and reactive, no palptable cervical adenopathy,  CV Resp: Clear to auscultation bilaterally GI: abdomen is soft, non-tender, non-distended, normal bowel sounds, no hepatosplenomegaly MSK: extremities are warm, no edema.  Skin: warm, no rash Neuro:  no focal deficits Psych: appropriate affect   Diagnostic Studies 10/2012 Echo  LVEF 50-55%, grade II diastolic dysfunction        Assessment and Plan   1. HTN  - elevated in clinic. She reports a history of white coat HTN. Electronically bp elevated SBP reported at 244. Manually 160/90 in both arms.  - will start chlorthalidone as additional bp agent, with BMET in 2 weeks. She is to call Monday or Tues with home bp values. Goal to get her better controlled prior to her rescheduled CEA. Once controlled will have to get her back in touch with vascular.          Antoine PocheJonathan F. Branch, M.D., F.A.C.C.

## 2014-02-13 NOTE — Patient Instructions (Signed)
Your physician recommends that you schedule a follow-up appointment in: 3 months. You will receive a reminder letter in the mail in about 1-2 months reminding you to call and schedule your appointment. If you don't receive this letter, please contact our office. Your physician has recommended you make the following change in your medication:  Start chlorthalidone 25 mg daily. Your new prescription was sent to your pharmacy. Continue all other medications the same. Your physician recommends that you return for a FASTING lipid profile and BMET in 2 weeks around February 27, 2014. Please do not eat or drink after midnight the night before you go. You may take your medications with a sip of water. Your physician has requested that you regularly monitor and record your blood pressure readings at home. Please use the same machine at the same time of day to check your readings. Please record them and call our office next Tuesday, February 17, 2014 with your results.

## 2014-02-13 NOTE — Telephone Encounter (Signed)
Will not mail lab order as patient has been added to schedule today per Dr. Wyline MoodBranch.

## 2014-02-17 ENCOUNTER — Telehealth: Payer: Self-pay | Admitting: *Deleted

## 2014-02-17 NOTE — Telephone Encounter (Signed)
02/15/14 @7 :00 am 146/67 HR 62 02/16/14 @7 :30 am 144/67 HR 52 02/17/14 @7 :30 am 131/63 HR 63

## 2014-02-17 NOTE — Telephone Encounter (Signed)
Pressures are better, would still like to see lower. Please start norvasc 5mg  daily. Continue all other meds. Call us back Friday with the numbers   Dominga FerryJ Dilana Mcphie MD

## 2014-02-17 NOTE — Telephone Encounter (Signed)
Pressures are better, would still like to see lower. Please start norvasc 5mg daily. Continue all other meds. Call us back Friday with the numbers   J Branch MD  

## 2014-02-18 NOTE — Telephone Encounter (Signed)
Patient informed and verbalized understanding of plan. 

## 2014-02-18 NOTE — Telephone Encounter (Signed)
Patient informed and said today her BP was 124/54 HR 58. On recheck this afternoon, BP was the same as this morning. Please confirm if you want norvasc added.

## 2014-02-18 NOTE — Telephone Encounter (Signed)
We can watch for a few more days. Still would like her to give us a call FriKoreaday with update on numbers. If looks good, will touch base with vascular about rescheduling her procedure.    Dominga FerryJ Branch MD

## 2014-02-20 ENCOUNTER — Other Ambulatory Visit: Payer: Self-pay | Admitting: *Deleted

## 2014-02-20 ENCOUNTER — Encounter (HOSPITAL_COMMUNITY): Payer: Self-pay | Admitting: Pharmacy Technician

## 2014-02-20 ENCOUNTER — Telehealth: Payer: Self-pay | Admitting: Cardiology

## 2014-02-20 NOTE — Telephone Encounter (Signed)
Patient informed. 

## 2014-02-20 NOTE — Telephone Encounter (Signed)
Blood pressures look good. I will forward a message to Dr Edilia Boickson to get in touch with her about rescheduling the surgery. Continue current meds   Annette FerryJ Starling Jessie MD

## 2014-02-20 NOTE — Telephone Encounter (Signed)
02/18/14  124/51 p 58 02/19/14  123/56 p.57 02/20/14  105/52 p.52

## 2014-02-23 ENCOUNTER — Encounter (HOSPITAL_COMMUNITY): Payer: Self-pay | Admitting: *Deleted

## 2014-02-23 MED ORDER — DEXTROSE 5 % IV SOLN
1.5000 g | INTRAVENOUS | Status: AC
  Administered 2014-02-24: 1.5 g via INTRAVENOUS
  Filled 2014-02-23: qty 1.5

## 2014-02-23 MED ORDER — SODIUM CHLORIDE 0.9 % IV SOLN: INTRAVENOUS | Status: DC

## 2014-02-23 NOTE — Progress Notes (Signed)
02/23/14 1313  OBSTRUCTIVE SLEEP APNEA  Have you ever been diagnosed with sleep apnea through a sleep study? No  Do you snore loudly (loud enough to be heard through closed doors)?  0  Do you often feel tired, fatigued, or sleepy during the daytime? 1  Has anyone observed you stop breathing during your sleep? 0  Do you have, or are you being treated for high blood pressure? 1  BMI more than 35 kg/m2? 1  Age over [address]759 years old? 1  Gender: 0  Obstructive Sleep Apnea Score 4

## 2014-02-23 NOTE — Progress Notes (Signed)
Pt stated that she takes her Prozac and Metoprolol at night.

## 2014-02-24 ENCOUNTER — Inpatient Hospital Stay (HOSPITAL_COMMUNITY): Payer: Medicare Other | Admitting: Anesthesiology

## 2014-02-24 ENCOUNTER — Telehealth: Payer: Self-pay | Admitting: Vascular Surgery

## 2014-02-24 ENCOUNTER — Encounter (HOSPITAL_COMMUNITY): Payer: Self-pay | Admitting: Anesthesiology

## 2014-02-24 ENCOUNTER — Inpatient Hospital Stay (HOSPITAL_COMMUNITY)
Admission: RE | Admit: 2014-02-24 | Discharge: 2014-02-25 | DRG: 038 | Disposition: A | Discharge status: Home / Self Care | Payer: Medicare Other | Source: Ambulatory Visit | Attending: Vascular Surgery | Admitting: Vascular Surgery

## 2014-02-24 ENCOUNTER — Encounter (HOSPITAL_COMMUNITY)
Admission: RE | Disposition: A | Discharge status: Home / Self Care | Payer: Self-pay | Source: Ambulatory Visit | Attending: Vascular Surgery

## 2014-02-24 ENCOUNTER — Encounter (HOSPITAL_COMMUNITY): Payer: Medicare Other | Admitting: Anesthesiology

## 2014-02-24 DIAGNOSIS — IMO0002 Reserved for concepts with insufficient information to code with codable children: Secondary | ICD-10-CM | POA: Diagnosis present

## 2014-02-24 DIAGNOSIS — F3289 Other specified depressive episodes: Secondary | ICD-10-CM | POA: Diagnosis present

## 2014-02-24 DIAGNOSIS — I2510 Atherosclerotic heart disease of native coronary artery without angina pectoris: Secondary | ICD-10-CM | POA: Diagnosis present

## 2014-02-24 DIAGNOSIS — Z6841 Body Mass Index (BMI) 40.0 and over, adult: Secondary | ICD-10-CM

## 2014-02-24 DIAGNOSIS — I739 Peripheral vascular disease, unspecified: Secondary | ICD-10-CM | POA: Diagnosis present

## 2014-02-24 DIAGNOSIS — Z951 Presence of aortocoronary bypass graft: Secondary | ICD-10-CM

## 2014-02-24 DIAGNOSIS — Z8249 Family history of ischemic heart disease and other diseases of the circulatory system: Secondary | ICD-10-CM | POA: Diagnosis not present

## 2014-02-24 DIAGNOSIS — E785 Hyperlipidemia, unspecified: Secondary | ICD-10-CM | POA: Diagnosis present

## 2014-02-24 DIAGNOSIS — I6522 Occlusion and stenosis of left carotid artery: Secondary | ICD-10-CM

## 2014-02-24 DIAGNOSIS — Z888 Allergy status to other drugs, medicaments and biological substances status: Secondary | ICD-10-CM | POA: Diagnosis not present

## 2014-02-24 DIAGNOSIS — I6529 Occlusion and stenosis of unspecified carotid artery: Principal | ICD-10-CM | POA: Diagnosis present

## 2014-02-24 DIAGNOSIS — I10 Essential (primary) hypertension: Secondary | ICD-10-CM | POA: Diagnosis present

## 2014-02-24 DIAGNOSIS — Z7982 Long term (current) use of aspirin: Secondary | ICD-10-CM | POA: Diagnosis not present

## 2014-02-24 DIAGNOSIS — J4489 Other specified chronic obstructive pulmonary disease: Secondary | ICD-10-CM | POA: Diagnosis present

## 2014-02-24 DIAGNOSIS — J449 Chronic obstructive pulmonary disease, unspecified: Secondary | ICD-10-CM | POA: Diagnosis present

## 2014-02-24 DIAGNOSIS — Z87891 Personal history of nicotine dependence: Secondary | ICD-10-CM | POA: Diagnosis not present

## 2014-02-24 DIAGNOSIS — F329 Major depressive disorder, single episode, unspecified: Secondary | ICD-10-CM | POA: Diagnosis present

## 2014-02-24 DIAGNOSIS — Z833 Family history of diabetes mellitus: Secondary | ICD-10-CM

## 2014-02-24 HISTORY — PX: ENDARTERECTOMY: SHX5162

## 2014-02-24 HISTORY — PX: PATCH ANGIOPLASTY: SHX6230

## 2014-02-24 HISTORY — DX: Other complications of anesthesia, initial encounter: T88.59XA

## 2014-02-24 HISTORY — DX: Adverse effect of unspecified anesthetic, initial encounter: T41.45XA

## 2014-02-24 HISTORY — DX: Other specified postprocedural states: Z98.890

## 2014-02-24 HISTORY — DX: Nausea with vomiting, unspecified: R11.2

## 2014-02-24 LAB — CBC
HCT: 45 % (ref 36.0–46.0)
Hemoglobin: 14.9 g/dL (ref 12.0–15.0)
MCH: 31 pg (ref 26.0–34.0)
MCHC: 33.1 g/dL (ref 30.0–36.0)
MCV: 93.8 fL (ref 78.0–100.0)
Platelets: 193 10*3/uL (ref 150–400)
RBC: 4.8 MIL/uL (ref 3.87–5.11)
RDW: 13.4 % (ref 11.5–15.5)
WBC: 6.5 10*3/uL (ref 4.0–10.5)

## 2014-02-24 LAB — TYPE AND SCREEN
ABO/RH(D): O POS
ANTIBODY SCREEN: NEGATIVE

## 2014-02-24 LAB — COMPREHENSIVE METABOLIC PANEL
ALBUMIN: 3.8 g/dL (ref 3.5–5.2)
ALK PHOS: 63 U/L (ref 39–117)
ALT: 25 U/L (ref 0–35)
AST: 23 U/L (ref 0–37)
Anion gap: 13 (ref 5–15)
BUN: 19 mg/dL (ref 6–23)
CHLORIDE: 104 meq/L (ref 96–112)
CO2: 27 mEq/L (ref 19–32)
CREATININE: 0.72 mg/dL (ref 0.50–1.10)
Calcium: 9.9 mg/dL (ref 8.4–10.5)
GFR calc Af Amer: >90 mL/min (ref >90)
GFR calc non Af Amer: 83 mL/min — ABNORMAL LOW (ref >90)
Glucose, Bld: 120 mg/dL — ABNORMAL HIGH (ref 70–99)
POTASSIUM: 4.6 meq/L (ref 3.7–5.3)
SODIUM: 144 meq/L (ref 137–147)
Total Bilirubin: 0.5 mg/dL (ref 0.3–1.2)
Total Protein: 6.9 g/dL (ref 6.0–8.3)

## 2014-02-24 LAB — PROTIME-INR
INR: 1 (ref 0.00–1.49)
PROTHROMBIN TIME: 13.2 s (ref 11.6–15.2)

## 2014-02-24 LAB — APTT: APTT: 27 s (ref 24–37)

## 2014-02-24 SURGERY — ENDARTERECTOMY, CAROTID
Anesthesia: General | Site: Neck | Laterality: Left

## 2014-02-24 MED ORDER — LIDOCAINE-EPINEPHRINE (PF) 1 %-1:200000 IJ SOLN
INTRAMUSCULAR | Status: AC
  Filled 2014-02-24: qty 10

## 2014-02-24 MED ORDER — DOPAMINE-DEXTROSE 3.2-5 MG/ML-% IV SOLN
2.0000 ug/kg/min | INTRAVENOUS | Status: DC
  Filled 2014-02-24: qty 250

## 2014-02-24 MED ORDER — LIDOCAINE HCL (CARDIAC) 20 MG/ML IV SOLN
INTRAVENOUS | Status: AC
  Filled 2014-02-24: qty 5

## 2014-02-24 MED ORDER — CHLORTHALIDONE 25 MG PO TABS
25.0000 mg | ORAL_TABLET | Freq: Every day | ORAL | Status: DC
  Administered 2014-02-25: 25 mg via ORAL
  Filled 2014-02-24 (×2): qty 1

## 2014-02-24 MED ORDER — SUCCINYLCHOLINE CHLORIDE 20 MG/ML IJ SOLN
INTRAMUSCULAR | Status: AC
  Filled 2014-02-24: qty 1

## 2014-02-24 MED ORDER — OXYCODONE-ACETAMINOPHEN 5-325 MG PO TABS
ORAL_TABLET | ORAL | Status: AC
  Filled 2014-02-24: qty 2

## 2014-02-24 MED ORDER — SUCCINYLCHOLINE CHLORIDE 20 MG/ML IJ SOLN
INTRAMUSCULAR | Status: DC | PRN
  Administered 2014-02-24: 100 mg via INTRAVENOUS

## 2014-02-24 MED ORDER — SENNOSIDES-DOCUSATE SODIUM 8.6-50 MG PO TABS
1.0000 | ORAL_TABLET | Freq: Every evening | ORAL | Status: DC | PRN
  Filled 2014-02-24: qty 1

## 2014-02-24 MED ORDER — CHLORHEXIDINE GLUCONATE 4 % EX LIQD
60.0000 mL | Freq: Once | CUTANEOUS | Status: DC
  Filled 2014-02-24: qty 60

## 2014-02-24 MED ORDER — PROPOFOL 10 MG/ML IV BOLUS
INTRAVENOUS | Status: AC
  Filled 2014-02-24: qty 20

## 2014-02-24 MED ORDER — ALUM & MAG HYDROXIDE-SIMETH 200-200-20 MG/5ML PO SUSP: 15.0000 mL | ORAL | Status: DC | PRN

## 2014-02-24 MED ORDER — FAMOTIDINE IN NACL 20-0.9 MG/50ML-% IV SOLN
20.0000 mg | Freq: Two times a day (BID) | INTRAVENOUS | Status: DC
  Administered 2014-02-24 – 2014-02-25 (×2): 20 mg via INTRAVENOUS
  Filled 2014-02-24 (×3): qty 50

## 2014-02-24 MED ORDER — ACETAMINOPHEN 325 MG PO TABS: 325.0000 mg | ORAL_TABLET | ORAL | Status: DC | PRN

## 2014-02-24 MED ORDER — ATORVASTATIN CALCIUM 80 MG PO TABS
80.0000 mg | ORAL_TABLET | Freq: Every day | ORAL | Status: DC
  Administered 2014-02-24: 80 mg via ORAL
  Filled 2014-02-24 (×2): qty 1

## 2014-02-24 MED ORDER — PROPOFOL 10 MG/ML IV BOLUS
INTRAVENOUS | Status: DC | PRN
  Administered 2014-02-24: 90 mg via INTRAVENOUS

## 2014-02-24 MED ORDER — LABETALOL HCL 5 MG/ML IV SOLN: 10.0000 mg | INTRAVENOUS | Status: DC | PRN

## 2014-02-24 MED ORDER — STERILE WATER FOR INJECTION IJ SOLN
INTRAMUSCULAR | Status: AC
  Filled 2014-02-24: qty 10

## 2014-02-24 MED ORDER — PHENOL 1.4 % MT LIQD
1.0000 | OROMUCOSAL | Status: DC | PRN
  Administered 2014-02-24: 1 via OROMUCOSAL
  Filled 2014-02-24: qty 177

## 2014-02-24 MED ORDER — PHENYLEPHRINE 40 MCG/ML (10ML) SYRINGE FOR IV PUSH (FOR BLOOD PRESSURE SUPPORT)
PREFILLED_SYRINGE | INTRAVENOUS | Status: AC
  Filled 2014-02-24: qty 10

## 2014-02-24 MED ORDER — THROMBIN 20000 UNITS EX SOLR
CUTANEOUS | Status: AC
  Filled 2014-02-24: qty 20000

## 2014-02-24 MED ORDER — NEOSTIGMINE METHYLSULFATE 10 MG/10ML IV SOLN
INTRAVENOUS | Status: AC
  Filled 2014-02-24: qty 1

## 2014-02-24 MED ORDER — ARTIFICIAL TEARS OP OINT
TOPICAL_OINTMENT | OPHTHALMIC | Status: DC | PRN
  Administered 2014-02-24: 1 via OPHTHALMIC

## 2014-02-24 MED ORDER — LIDOCAINE HCL (PF) 1 % IJ SOLN
INTRAMUSCULAR | Status: AC
  Filled 2014-02-24: qty 30

## 2014-02-24 MED ORDER — ROCURONIUM BROMIDE 100 MG/10ML IV SOLN
INTRAVENOUS | Status: DC | PRN
  Administered 2014-02-24: 30 mg via INTRAVENOUS

## 2014-02-24 MED ORDER — SODIUM CHLORIDE 0.9 % IR SOLN
Status: DC | PRN
  Administered 2014-02-24: 07:00:00

## 2014-02-24 MED ORDER — ASPIRIN 81 MG PO TABS: 81.0000 mg | ORAL_TABLET | Freq: Every day | ORAL | Status: DC

## 2014-02-24 MED ORDER — POTASSIUM CHLORIDE CRYS ER 20 MEQ PO TBCR: 20.0000 meq | EXTENDED_RELEASE_TABLET | Freq: Every day | ORAL | Status: DC | PRN

## 2014-02-24 MED ORDER — SCOPOLAMINE 1 MG/3DAYS TD PT72
MEDICATED_PATCH | TRANSDERMAL | Status: AC
  Filled 2014-02-24: qty 1

## 2014-02-24 MED ORDER — FENTANYL CITRATE 0.05 MG/ML IJ SOLN
INTRAMUSCULAR | Status: DC | PRN
  Administered 2014-02-24: 50 ug via INTRAVENOUS
  Administered 2014-02-24: 200 ug via INTRAVENOUS

## 2014-02-24 MED ORDER — BISACODYL 10 MG RE SUPP: 10.0000 mg | Freq: Every day | RECTAL | Status: DC | PRN

## 2014-02-24 MED ORDER — 0.9 % SODIUM CHLORIDE (POUR BTL) OPTIME
TOPICAL | Status: DC | PRN
  Administered 2014-02-24: 2000 mL

## 2014-02-24 MED ORDER — DROPERIDOL 2.5 MG/ML IJ SOLN
0.6250 mg | INTRAMUSCULAR | Status: DC | PRN
  Filled 2014-02-24: qty 0.25

## 2014-02-24 MED ORDER — SCOPOLAMINE 1 MG/3DAYS TD PT72
1.0000 | MEDICATED_PATCH | Freq: Once | TRANSDERMAL | Status: AC
  Administered 2014-02-24: 1 via TRANSDERMAL

## 2014-02-24 MED ORDER — LABETALOL HCL 5 MG/ML IV SOLN
INTRAVENOUS | Status: AC
  Filled 2014-02-24: qty 4

## 2014-02-24 MED ORDER — LISINOPRIL 40 MG PO TABS
40.0000 mg | ORAL_TABLET | Freq: Every day | ORAL | Status: DC
  Administered 2014-02-25: 40 mg via ORAL
  Filled 2014-02-24 (×2): qty 1

## 2014-02-24 MED ORDER — ROCURONIUM BROMIDE 50 MG/5ML IV SOLN
INTRAVENOUS | Status: AC
  Filled 2014-02-24: qty 1

## 2014-02-24 MED ORDER — EPHEDRINE SULFATE 50 MG/ML IJ SOLN
INTRAMUSCULAR | Status: AC
Start: 2014-02-24 — End: 2014-02-24
  Filled 2014-02-24: qty 1

## 2014-02-24 MED ORDER — HYDRALAZINE HCL 20 MG/ML IJ SOLN: 10.0000 mg | INTRAMUSCULAR | Status: DC | PRN

## 2014-02-24 MED ORDER — ONDANSETRON HCL 4 MG/2ML IJ SOLN
INTRAMUSCULAR | Status: DC | PRN
  Administered 2014-02-24: 4 mg via INTRAVENOUS

## 2014-02-24 MED ORDER — DEXTROSE 5 % IV SOLN
1.5000 g | Freq: Two times a day (BID) | INTRAVENOUS | Status: AC
  Administered 2014-02-24 – 2014-02-25 (×2): 1.5 g via INTRAVENOUS
  Filled 2014-02-24 (×2): qty 1.5

## 2014-02-24 MED ORDER — SODIUM CHLORIDE 0.9 % IV SOLN
500.0000 mL | Freq: Once | INTRAVENOUS | Status: AC | PRN
  Administered 2014-02-24: 500 mL via INTRAVENOUS

## 2014-02-24 MED ORDER — GUAIFENESIN-DM 100-10 MG/5ML PO SYRP: 15.0000 mL | ORAL_SOLUTION | ORAL | Status: DC | PRN

## 2014-02-24 MED ORDER — FENTANYL CITRATE 0.05 MG/ML IJ SOLN
25.0000 ug | INTRAMUSCULAR | Status: DC | PRN
  Administered 2014-02-24 (×2): 25 ug via INTRAVENOUS

## 2014-02-24 MED ORDER — LIDOCAINE-EPINEPHRINE (PF) 1 %-1:200000 IJ SOLN
INTRAMUSCULAR | Status: DC | PRN
  Administered 2014-02-24: 30 mL

## 2014-02-24 MED ORDER — LIDOCAINE HCL (CARDIAC) 20 MG/ML IV SOLN
INTRAVENOUS | Status: DC | PRN
  Administered 2014-02-24: 20 mg via INTRAVENOUS

## 2014-02-24 MED ORDER — METOPROLOL SUCCINATE ER 25 MG PO TB24
25.0000 mg | ORAL_TABLET | Freq: Every day | ORAL | Status: DC
  Administered 2014-02-25: 25 mg via ORAL
  Filled 2014-02-24 (×2): qty 1

## 2014-02-24 MED ORDER — LABETALOL HCL 5 MG/ML IV SOLN
INTRAVENOUS | Status: DC | PRN
  Administered 2014-02-24 (×3): 5 mg via INTRAVENOUS

## 2014-02-24 MED ORDER — NEOSTIGMINE METHYLSULFATE 10 MG/10ML IV SOLN
INTRAVENOUS | Status: DC | PRN
  Administered 2014-02-24: 3 mg via INTRAVENOUS

## 2014-02-24 MED ORDER — MORPHINE SULFATE 2 MG/ML IJ SOLN
2.0000 mg | INTRAMUSCULAR | Status: DC | PRN
  Administered 2014-02-24: 2 mg via INTRAVENOUS
  Filled 2014-02-24: qty 1

## 2014-02-24 MED ORDER — SODIUM CHLORIDE 0.9 % IV SOLN
INTRAVENOUS | Status: DC
  Administered 2014-02-24: 75 mL/h via INTRAVENOUS

## 2014-02-24 MED ORDER — ACETAMINOPHEN 650 MG RE SUPP: 325.0000 mg | RECTAL | Status: DC | PRN

## 2014-02-24 MED ORDER — KETOTIFEN FUMARATE 0.025 % OP SOLN
1.0000 [drp] | Freq: Every day | OPHTHALMIC | Status: DC | PRN
  Filled 2014-02-24: qty 5

## 2014-02-24 MED ORDER — HEPARIN SODIUM (PORCINE) 1000 UNIT/ML IJ SOLN
INTRAMUSCULAR | Status: DC | PRN
  Administered 2014-02-24: 8000 [IU] via INTRAVENOUS

## 2014-02-24 MED ORDER — DOPAMINE-DEXTROSE 3.2-5 MG/ML-% IV SOLN: 3.0000 ug/kg/min | INTRAVENOUS | Status: DC

## 2014-02-24 MED ORDER — EPHEDRINE SULFATE 50 MG/ML IJ SOLN
INTRAMUSCULAR | Status: DC | PRN
  Administered 2014-02-24 (×5): 10 mg via INTRAVENOUS

## 2014-02-24 MED ORDER — DOPAMINE-DEXTROSE 3.2-5 MG/ML-% IV SOLN
INTRAVENOUS | Status: DC | PRN
  Administered 2014-02-24: 3 ug/kg/min via INTRAVENOUS

## 2014-02-24 MED ORDER — MAGNESIUM SULFATE 40 MG/ML IJ SOLN: 2.0000 g | Freq: Every day | INTRAMUSCULAR | Status: DC | PRN

## 2014-02-24 MED ORDER — DOCUSATE SODIUM 100 MG PO CAPS
100.0000 mg | ORAL_CAPSULE | Freq: Every day | ORAL | Status: DC
Start: 2014-02-25 — End: 2014-02-25
  Administered 2014-02-25: 100 mg via ORAL
  Filled 2014-02-24: qty 1

## 2014-02-24 MED ORDER — OXYCODONE-ACETAMINOPHEN 5-325 MG PO TABS
1.0000 | ORAL_TABLET | ORAL | Status: DC | PRN
  Administered 2014-02-24 (×2): 2 via ORAL
  Filled 2014-02-24: qty 2

## 2014-02-24 MED ORDER — FLUOXETINE HCL 20 MG PO CAPS
40.0000 mg | ORAL_CAPSULE | Freq: Every day | ORAL | Status: DC
  Administered 2014-02-24 – 2014-02-25 (×2): 40 mg via ORAL
  Filled 2014-02-24 (×2): qty 2

## 2014-02-24 MED ORDER — ENOXAPARIN SODIUM 30 MG/0.3ML ~~LOC~~ SOLN
30.0000 mg | SUBCUTANEOUS | Status: DC
  Administered 2014-02-25: 30 mg via SUBCUTANEOUS
  Filled 2014-02-24 (×2): qty 0.3

## 2014-02-24 MED ORDER — WHITE PETROLATUM GEL
Status: AC
  Filled 2014-02-24: qty 5

## 2014-02-24 MED ORDER — LIDOCAINE HCL 4 % MT SOLN
OROMUCOSAL | Status: DC | PRN
Start: 2014-02-24 — End: 2014-02-24
  Administered 2014-02-24: 4 mL via TOPICAL

## 2014-02-24 MED ORDER — PROTAMINE SULFATE 10 MG/ML IV SOLN
INTRAVENOUS | Status: DC | PRN
  Administered 2014-02-24 (×4): 10 mg via INTRAVENOUS

## 2014-02-24 MED ORDER — GLYCOPYRROLATE 0.2 MG/ML IJ SOLN
INTRAMUSCULAR | Status: AC
  Filled 2014-02-24: qty 3

## 2014-02-24 MED ORDER — HEPARIN SODIUM (PORCINE) 1000 UNIT/ML IJ SOLN
INTRAMUSCULAR | Status: AC
  Filled 2014-02-24: qty 1

## 2014-02-24 MED ORDER — EPHEDRINE SULFATE 50 MG/ML IJ SOLN
INTRAMUSCULAR | Status: AC
  Filled 2014-02-24: qty 1

## 2014-02-24 MED ORDER — FENTANYL CITRATE 0.05 MG/ML IJ SOLN
INTRAMUSCULAR | Status: AC
  Filled 2014-02-24: qty 5

## 2014-02-24 MED ORDER — METOPROLOL TARTRATE 1 MG/ML IV SOLN: 2.0000 mg | INTRAVENOUS | Status: DC | PRN

## 2014-02-24 MED ORDER — ONDANSETRON HCL 4 MG/2ML IJ SOLN
INTRAMUSCULAR | Status: AC
  Filled 2014-02-24: qty 2

## 2014-02-24 MED ORDER — SODIUM CHLORIDE 0.9 % IJ SOLN
INTRAMUSCULAR | Status: AC
  Filled 2014-02-24: qty 10

## 2014-02-24 MED ORDER — ASPIRIN 81 MG PO CHEW
81.0000 mg | CHEWABLE_TABLET | Freq: Every day | ORAL | Status: DC
  Administered 2014-02-24 – 2014-02-25 (×2): 81 mg via ORAL
  Filled 2014-02-24 (×2): qty 1

## 2014-02-24 MED ORDER — PROTAMINE SULFATE 10 MG/ML IV SOLN
INTRAVENOUS | Status: AC
  Filled 2014-02-24: qty 5

## 2014-02-24 MED ORDER — ONDANSETRON HCL 4 MG/2ML IJ SOLN
4.0000 mg | Freq: Four times a day (QID) | INTRAMUSCULAR | Status: DC | PRN
  Administered 2014-02-25: 4 mg via INTRAVENOUS
  Filled 2014-02-24: qty 2

## 2014-02-24 MED ORDER — LACTATED RINGERS IV SOLN
INTRAVENOUS | Status: DC | PRN
  Administered 2014-02-24 (×2): via INTRAVENOUS

## 2014-02-24 MED ORDER — GLYCOPYRROLATE 0.2 MG/ML IJ SOLN
INTRAMUSCULAR | Status: DC | PRN
  Administered 2014-02-24: 0.2 mg via INTRAVENOUS
  Administered 2014-02-24: 0.4 mg via INTRAVENOUS

## 2014-02-24 MED ORDER — FENTANYL CITRATE 0.05 MG/ML IJ SOLN
INTRAMUSCULAR | Status: AC
  Administered 2014-02-24: 25 ug via INTRAVENOUS
  Filled 2014-02-24: qty 2

## 2014-02-24 SURGICAL SUPPLY — 56 items
BAG DECANTER FOR FLEXI CONT (MISCELLANEOUS) ×3 IMPLANT
BLADE SURG 10 STRL SS (BLADE) IMPLANT
CANISTER SUCTION 2500CC (MISCELLANEOUS) ×3 IMPLANT
CANNULA VESSEL 3MM 2 BLNT TIP (CANNULA) ×3 IMPLANT
CATH ROBINSON RED A/P 18FR (CATHETERS) ×3 IMPLANT
CLIP TI MEDIUM 24 (CLIP) ×3 IMPLANT
CLIP TI WIDE RED SMALL 24 (CLIP) ×3 IMPLANT
CRADLE DONUT ADULT HEAD (MISCELLANEOUS) ×3 IMPLANT
DERMABOND ADVANCED (GAUZE/BANDAGES/DRESSINGS) ×2
DERMABOND ADVANCED .7 DNX12 (GAUZE/BANDAGES/DRESSINGS) ×1 IMPLANT
DRAIN CHANNEL 15F RND FF W/TCR (WOUND CARE) IMPLANT
ELECT REM PT RETURN 9FT ADLT (ELECTROSURGICAL) ×3
ELECTRODE REM PT RTRN 9FT ADLT (ELECTROSURGICAL) ×1 IMPLANT
EVACUATOR SILICONE 100CC (DRAIN) IMPLANT
GAUZE SPONGE 4X4 12PLY STRL (GAUZE/BANDAGES/DRESSINGS) IMPLANT
GEL ULTRASOUND 20GR AQUASONIC (MISCELLANEOUS) ×3 IMPLANT
GLOVE BIO SURGEON STRL SZ 6.5 (GLOVE) ×2 IMPLANT
GLOVE BIO SURGEON STRL SZ7.5 (GLOVE) ×3 IMPLANT
GLOVE BIO SURGEONS STRL SZ 6.5 (GLOVE) ×1
GLOVE BIOGEL PI IND STRL 7.0 (GLOVE) ×1 IMPLANT
GLOVE BIOGEL PI IND STRL 7.5 (GLOVE) ×1 IMPLANT
GLOVE BIOGEL PI IND STRL 8 (GLOVE) ×1 IMPLANT
GLOVE BIOGEL PI INDICATOR 7.0 (GLOVE) ×2
GLOVE BIOGEL PI INDICATOR 7.5 (GLOVE) ×2
GLOVE BIOGEL PI INDICATOR 8 (GLOVE) ×2
GLOVE ECLIPSE 6.5 STRL STRAW (GLOVE) ×3 IMPLANT
GLOVE SS BIOGEL STRL SZ 7 (GLOVE) ×1 IMPLANT
GLOVE SUPERSENSE BIOGEL SZ 7 (GLOVE) ×2
GLOVE SURG SS PI 6.5 STRL IVOR (GLOVE) ×3 IMPLANT
GOWN STRL REUS W/ TWL LRG LVL3 (GOWN DISPOSABLE) ×3 IMPLANT
GOWN STRL REUS W/ TWL XL LVL3 (GOWN DISPOSABLE) ×1 IMPLANT
GOWN STRL REUS W/TWL LRG LVL3 (GOWN DISPOSABLE) ×6
GOWN STRL REUS W/TWL XL LVL3 (GOWN DISPOSABLE) ×2
KIT BASIN OR (CUSTOM PROCEDURE TRAY) ×3 IMPLANT
KIT ROOM TURNOVER OR (KITS) ×3 IMPLANT
KIT SHUNT ARGYLE CAROTID ART 6 (VASCULAR PRODUCTS) ×3 IMPLANT
NEEDLE 18GX1X1/2 (RX/OR ONLY) (NEEDLE) ×3 IMPLANT
NEEDLE HYPO 25X1 1.5 SAFETY (NEEDLE) ×3 IMPLANT
NS IRRIG 1000ML POUR BTL (IV SOLUTION) ×6 IMPLANT
PACK CAROTID (CUSTOM PROCEDURE TRAY) ×3 IMPLANT
PAD ARMBOARD 7.5X6 YLW CONV (MISCELLANEOUS) ×6 IMPLANT
PATCH VASCULAR VASCU GUARD 1X6 (Vascular Products) ×3 IMPLANT
SHUNT CAROTID BYPASS 10 (VASCULAR PRODUCTS) IMPLANT
SHUNT CAROTID BYPASS 12FRX15.5 (VASCULAR PRODUCTS) IMPLANT
SPONGE INTESTINAL PEANUT (DISPOSABLE) ×3 IMPLANT
SPONGE SURGIFOAM ABS GEL 100 (HEMOSTASIS) IMPLANT
SUT PROLENE 6 0 BV (SUTURE) ×9 IMPLANT
SUT PROLENE 7 0 BV 1 (SUTURE) IMPLANT
SUT SILK 2 0 FS (SUTURE) IMPLANT
SUT SILK 3 0 TIES 17X18 (SUTURE)
SUT SILK 3-0 18XBRD TIE BLK (SUTURE) IMPLANT
SUT VIC AB 3-0 SH 27 (SUTURE) ×2
SUT VIC AB 3-0 SH 27X BRD (SUTURE) ×1 IMPLANT
SUT VICRYL 4-0 PS2 18IN ABS (SUTURE) ×3 IMPLANT
SYR CONTROL 10ML LL (SYRINGE) ×3 IMPLANT
WATER STERILE IRR 1000ML POUR (IV SOLUTION) ×3 IMPLANT

## 2014-02-24 NOTE — Progress Notes (Signed)
Utilization review completed. Thurma Priego, RN, BSN. 

## 2014-02-24 NOTE — Anesthesia Preprocedure Evaluation (Addendum)
Anesthesia Evaluation  Patient identified by MRN, date of birth, ID band Patient awake    Reviewed: Allergy & Precautions, H&P , NPO status , Patient's Chart, lab work & pertinent test results, reviewed documented beta blocker date and time   History of Anesthesia Complications (+) PONV and history of anesthetic complications  Airway Mallampati: II TM Distance: >3 FB Neck ROM: Full    Dental  (+) Edentulous Upper, Dental Advisory Given, Missing   Pulmonary COPDformer smoker (quit '05),  breath sounds clear to auscultation  Pulmonary exam normal       Cardiovascular hypertension, Pt. on medications and Pt. on home beta blockers + CAD, + CABG ('05) and + Peripheral Vascular Disease Rhythm:Regular  '13 Stress test: EF 62%, normal perfusion   Neuro/Psych Depression TIA   GI/Hepatic negative GI ROS, Neg liver ROS,   Endo/Other  Morbid obesity  Renal/GU negative Renal ROS     Musculoskeletal   Abdominal (+) + obese,   Peds  Hematology   Anesthesia Other Findings   Reproductive/Obstetrics                          Anesthesia Physical Anesthesia Plan  ASA: III  Anesthesia Plan:    Post-op Pain Management:    Induction: Intravenous  Airway Management Planned: Oral ETT  Additional Equipment: Arterial line  Intra-op Plan:   Post-operative Plan: Extubation in OR  Informed Consent: I have reviewed the patients History and Physical, chart, labs and discussed the procedure including the risks, benefits and alternatives for the proposed anesthesia with the patient or authorized representative who has indicated his/her understanding and acceptance.   Dental advisory given  Plan Discussed with: Surgeon and CRNA  Anesthesia Plan Comments: (Plan routine monitors, A line, GETA)        Anesthesia Quick Evaluation

## 2014-02-24 NOTE — Telephone Encounter (Addendum)
Message copied by Fredrich BirksMILLIKAN, DANA P on Tue Feb 24, 2014  3:51 PM ------      Message from: Phillips OdorPULLINS, CAROL S      Created: Tue Feb 24, 2014 11:14 AM      Regarding: Tiburcio BashKay log; also needs 2-3 wk. f/u with [facility]                   ----- Message -----         From: Chuck Hinthristopher S Dickson, MD         Sent: 02/24/2014  10:07 AM           To: [facility] Charge Pool      Subject: charge and f/u                                           PROCEDURE: left carotid endarterectomy with bovine pericardial patch angioplasty            SURGEON: Di Kindlehristopher S. Edilia Boickson, MD, FACS            ASSIST: Lianne CureMaureen Collins PA            She will need a follow up in about 2-3 weeks. Thank you. CD ------  02/24/14: lm for pt re appt, dpm

## 2014-02-24 NOTE — Progress Notes (Signed)
Pt has had 500cc NS bolus X2. BP remains low 100's systolic. Pt A&O X3. Asymptomatic. Dr. Edilia Boickson @ bedside. Comfortable w/ current VS. No new orders.

## 2014-02-24 NOTE — Progress Notes (Signed)
   VASCULAR SURGERY ASSESSMENT & PLAN:  * Stable post op  * BP stable after 500 cc X 2  SUBJECTIVE: No complaints  PHYSICAL EXAM: Filed Vitals:   02/24/14 1045 02/24/14 1100 02/24/14 1115 02/24/14 1130  BP: 138/47 119/48 104/42   Pulse: 75 72 66 72  Temp:   98.1 F (36.7 C)   TempSrc:      Resp: 16 15 14 13   Weight:      SpO2: 95% 98% 96% 96%   Neuro intact Incision looks fine  LABS: Lab Results  Component Value Date   WBC 6.5 02/24/2014   HGB 14.9 02/24/2014   HCT 45.0 02/24/2014   MCV 93.8 02/24/2014   PLT 193 02/24/2014   Lab Results  Component Value Date   CREATININE 0.72 02/24/2014   Lab Results  Component Value Date   INR 1.00 02/24/2014   Active Problems:   Carotid stenosis  Annette Cooper Beeper: [phone] 02/24/2014

## 2014-02-24 NOTE — H&P (View-Only) (Signed)
[facility] [place] HISTORY AND PHYSICAL   CC:  Carotid artery stenosis  [name], Annette T, MD  HPI: This is a 73 y.o. female who presented to her cardiologist for a routine checkup.  At that appointment, he heard a left carotid bruit and obtained a carotid duplex, which revealed 80-99% stenosis on the left and 40-59% stenosis on the right.  She has been asymptomatic and denies amaurosis fugax, weakness or numbness of either side of the body as well as speech difficulties.  She states that every couple of months, she will see what she describes as a lightening bolt in the center of her vision that can last up to 20 minutes, but does not obstruct her vision.  This is bilateral.  She is right handed.  She is on a statin for her cholesterol.  She is on a beta blocker and ACEI for her hypertension and she takes a daily baby aspirin.    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  She also quit smoking 10 years ago.  She walks 3x weekly at the church.  She presents for further recommendations for her carotid artery stenosis.  Past Medical History  Diagnosis Date  . Hypertension   . Hyperlipidemia   . 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  . Depression     History   . Nodule of right lung 01/2002    CT SCAN.  Three nodules were noted on CT in 2001.  . Shortness of breath   . Peripheral vascular disease   . CAD (coronary artery disease)    Past Surgical History  Procedure Laterality Date  . Coronary artery bypass graft  2005    (saphenous vein graft to the  left anterior descending, saphenous vein graft to the ramus intermedius     saphenous vein graft to the right coronary artery  . Re-excision prepatellar bursa      Left Knee  . Hammer toe surgery      Right second toe  . Wrist surgery      Right  . Tubal ligation      Allergies  Allergen Reactions  . Bacitracin-Polymyxin B   .  Neomycin-Bacitracin Zn-Polymyx   . Polysorbate Rash and Swelling    Polysporins, etc    Current Outpatient Prescriptions  Medication Sig Dispense Refill  . aspirin 81 MG tablet Take 162 mg by mouth daily.        . Calcium Carbonate-Vitamin D (CALTRATE 600+D) 600-400 MG-UNIT per tablet Take 4 tablets by mouth daily.        . Cholecalciferol (VITAMIN D3) 1000 UNITS CAPS Take by mouth 2 (two) times daily.      . diphenhydramine-acetaminophen (TYLENOL PM) 25-500 MG TABS Take 1 tablet by mouth at bedtime as needed.        . FLUoxetine (PROZAC) 20 MG capsule Take 40 mg by mouth daily.        . lisinopril (PRINIVIL,ZESTRIL) 20 MG tablet Take 20 mg by mouth daily.      . metoprolol succinate (TOPROL-XL) 50 MG 24 hr tablet Take 1 tablet (50 mg total) by mouth daily.  30 tablet  6  . Multiple Vitamin (MULTIVITAMIN) tablet Take 1 tablet by mouth daily.        . rosuvastatin (CRESTOR) 40 MG tablet Take 1 tablet (40 mg total) by mouth daily.  90 tablet  3     No current facility-administered medications for this visit.    Family History  Problem Relation Age of Onset  . Diabetes Mother   . Hyperlipidemia Mother   . Hypertension Mother   . Peripheral vascular disease Mother     amputation  . Diabetes Father   . Heart disease Father     beofre age 60  . Hyperlipidemia Father   . Hypertension Father   . Heart attack Father   . Diabetes Sister   . Hyperlipidemia Sister   . Hypertension Sister   . Diabetes Brother   . Heart disease Brother     before age 60  . Hyperlipidemia Brother   . Hypertension Brother   . Peripheral vascular disease Brother     History   Social History  . Marital Status: Married    Spouse Name: Annette Cooper    Number of Children: N/A  . Years of Education: N/A   Occupational History  . RETIRED    Social History Main Topics  . Smoking status: Former Smoker -- 1.00 packs/day for 50 years    Types: Cigarettes    Quit date: 08/13/2003  . Smokeless tobacco: Not on  file  . Alcohol Use: No  . Drug Use: No  . Sexual Activity: Not on file   Other Topics Concern  . Not on file   Social History Narrative  . No narrative on file     ROS: [x] Positive   [ ] Negative   [ ] All sytems reviewed and are negative  Cardiovascular: [] chest pain/pressure [] palpitations [] SOB lying flat [] DOE [] pain in legs while walking [] pain in feet when lying flat [] hx of DVT [] hx of phlebitis [] swelling in legs [] varicose veins  Pulmonary: [] productive cough [] asthma [] wheezing  Neurologic: [] weakness in [] arms [] legs [] numbness in [] arms [] legs []difficulty speaking or slurred speech [] temporary loss of vision in one eye [] dizziness  Hematologic: [] bleeding problems [] problems with blood clotting easily  GI [] vomiting blood [] blood in stool  GU: [] burning with urination [] blood in urine  Psychiatric: [] hx of major depression  Integumentary: [] rashes [] ulcers  Constitutional: [] fever [] chills   PHYSICAL EXAMINATION:  Filed Vitals:   02/04/14 1531  BP: 156/64  Pulse:    Body mass index is 39.96 kg/(m^2).  General:  WDWN in NAD Gait: Not observed HENT: WNL, normocephalic Eyes: Pupils equal Pulmonary: normal non-labored breathing , without Rales, rhonchi,  wheezing Cardiac: RRR, without  Murmurs, rubs or gallops; with faint left carotid bruit Abdomen: soft, NT, no masses Skin: without rashes, without ulcers  Vascular Exam/Pulses:  Right Left  Radial 1+ (weak) 2+ (normal)  Ulnar 2+ (normal) 2+ (normal)  Femoral Unable to palpate Unable to palpate  Popliteal Unable to palpate Unable to palpate  DP 2+ (normal) 2+ (normal)    Extremities: without ischemic changes, without Gangrene , without cellulitis; without open wounds;  Musculoskeletal: no muscle wasting or atrophy  Neurologic: A&O X 3; Appropriate Affect ; SENSATION: normal; MOTOR FUNCTION:  moving all extremities equally. Speech is  fluent/normal   Non-Invasive Vascular Imaging:    80-99% stenosis of the left carotid artery 40-59% stenosis of the right carotid artery  The location of the stenosis is at the ICA only She does have a normal ICA past the stenosis Anatomy is within normal limits The bifurcation is mid (  hyoid notch)  Pt meds includes: Statin:  Yes.   Beta Blocker:  Yes.   Aspirin:  Yes.   ACEI:  Yes.   ARB:  No. Other Antiplatelet/Anticoagulant:  No.   ASSESSMENT/PLAN:: 73 y.o. female with severe asymptomatic left carotid artery stenosis in the range of 80-99% stenosis.   -Will proceed with left CEA on 02/12/14.  She will continue her baby aspirin.   Samantha Rhyne, PA-C [facility] [phone]  Clinic MD:  Pt seen and examined in conjunction with Dr. Dickson  Agree with above.  The patient has an asymptomatic greater than 80% left carotid stenosis. Peak systolic velocity 632 cm/s with end-diastolic velocity of 179 cm/s. I've recommended left carotid endarterectomy. I have reviewed the indications for carotid endarterectomy, that is to lower the risk of future stroke. I have also reviewed the potential complications of surgery, including but not limited to: bleeding, stroke (perioperative risk 1-2%), MI, nerve injury of other unpredictable medical problems. All of the patients questions were answered and they are agreeable to proceed with surgery. Her surgery is scheduled for 02/12/2014.  Christopher Dickson, MD, FACS Beeper [phone] 02/04/2014  

## 2014-02-24 NOTE — Transfer of Care (Signed)
Immediate Anesthesia Transfer of Care Note  Patient: Annette MagnusonAudrey A Holeman  Procedure(s) Performed: Procedure(s): ENDARTERECTOMY CAROTID (Left) PATCH ANGIOPLASTY, Left Carotid Artery (Left)  Patient Location: PACU  Anesthesia Type:General  Level of Consciousness: awake, alert  and oriented  Airway & Oxygen Therapy: Patient Spontanous Breathing and Patient connected to nasal cannula oxygen  Post-op Assessment: Report given to PACU RN, Post -op Vital signs reviewed and stable, Patient moving all extremities X 4 and Patient able to stick tongue midline  Post vital signs: Reviewed and stable  Complications: No apparent anesthesia complications

## 2014-02-24 NOTE — Anesthesia Postprocedure Evaluation (Signed)
  Anesthesia Post-op Note  Patient: Annette MagnusonAudrey A Cooper  Procedure(s) Performed: Procedure(s): ENDARTERECTOMY CAROTID (Left) PATCH ANGIOPLASTY, Left Carotid Artery (Left)  Patient Location: PACU  Anesthesia Type:General  Level of Consciousness: awake, alert , oriented and patient cooperative  Airway and Oxygen Therapy: Patient Spontanous Breathing and Patient connected to nasal cannula oxygen  Post-op Pain: none  Post-op Assessment: Post-op Vital signs reviewed, Patient's Cardiovascular Status Stable, Respiratory Function Stable, Patent Airway, No signs of Nausea or vomiting and Pain level controlled  Post-op Vital Signs: Reviewed and stable  Last Vitals:  Filed Vitals:   02/24/14 1130  BP:   Pulse: 72  Temp:   Resp: 13    Complications: No apparent anesthesia complications

## 2014-02-24 NOTE — Anesthesia Procedure Notes (Signed)
Procedure Name: Intubation Date/Time: 02/24/2014 7:49 AM Performed by: Susa Loffler Pre-anesthesia Checklist: Patient identified, Timeout performed, Emergency Drugs available, Suction available and Patient being monitored Patient Re-evaluated:Patient Re-evaluated prior to inductionOxygen Delivery Method: Circle system utilized Preoxygenation: Pre-oxygenation with 100% oxygen Intubation Type: IV induction, Rapid sequence and Cricoid Pressure applied Laryngoscope Size: Mac and 4 Grade View: Grade I Tube type: Oral Tube size: 7.5 mm Number of attempts: 1 Airway Equipment and Method: Stylet and LTA kit utilized Placement Confirmation: ETT inserted through vocal cords under direct vision,  positive ETCO2 and breath sounds checked- equal and bilateral Secured at: 22 cm Tube secured with: Tape Dental Injury: Teeth and Oropharynx as per pre-operative assessment  Comments: RSI d/t pt feeling nauseated when moving from stretcher to OR table. KO

## 2014-02-24 NOTE — Op Note (Signed)
    NAME: Annette Cooper   MRN: [id number] DOB: [birth year]    DATE OF OPERATION: 02/24/2014  PREOP DIAGNOSIS: Asymptomatic greater than 80% left carotid stenosis  POSTOP DIAGNOSIS: same  PROCEDURE: left carotid endarterectomy with bovine pericardial patch angioplasty  SURGEON: Di Kindlehristopher S. Edilia Boickson, MD, FACS  ASSIST: Lianne CureMaureen Collins PA  ANESTHESIA: Gen.   EBL: minimal  INDICATIONS: Annette Cooper is a 73 y.o. female who was found have a left carotid bruit. This prompted a duplex scan which showed a greater than 80% left carotid stenosis. She is brought in for elective left carotid endarterectomy  FINDINGS: greater than 95% left carotid stenosis  TECHNIQUE: The patient was taken to the operating room and received a general anesthetic. Of note she initially had some bradycardia but this resolved and her blood pressure remained stable. The neck and upper chest were prepped and draped in the usual sterile fashion. An incision was made along the anterior border of the sternocleidomastoid and the dissection carried down to the common carotid artery which was dissected free and controlled with a Rummel tourniquet. The facial vein was divided between 2-0 silk ties. The internal carotid artery was controlled above the plaque it was fairly small. The external carotid artery and superior thyroid artery were controlled. The patient was heparinized. Clamps were then placed on the internal then the common then the external carotid artery. A longitudinal arteriotomy was made in the common carotid artery and this was extended through the plaque into the internal carotid artery. A 10 shunt was placed into the internal carotid artery, black bled and then placed into the common carotid artery and secured with Rummel tourniquet. Flow was reestablished the shunt. An endarterectomy plane was established proximally. The plaque was sharply divided. Eversion endarterectomy was performed of the external carotid artery.  Distally there was a nice tapering the plaque and 2 tacking sutures were placed. The artery was irrigated with copious amounts of heparin and all loose debris removed. A bovine pericardial patch was then tailored and sewn with continuous 6-0 Prolene suture. Prior to completing the patch closure, the shunt was removed. The arteries were backbled and flushed appropriately and the anastomosis completed. Flow was reestablished first to the external carotid artery and then to the internal carotid artery. Adventitia was an excellent signal with the Doppler beyond the patch. The heparin was partially reversed with protamine. The wounds closed the deep layer of 3-0 Vicryl, the platysma was closed with running 3-0 Vicryl, and the skin was closed with 4-0 silk ligature stitch. Dermabond was applied. The patient tolerated the procedure well and woke neurologically intact. All needle and sponge counts were correct. This was transferred to the recovery room in good condition.  Waverly Ferrarihristopher Annette Thier, MD, FACS [facility]  DATE OF DICTATION:   02/24/2014

## 2014-02-24 NOTE — Interval H&P Note (Signed)
History and Physical Interval Note:  02/24/2014 7:40 AM  Annette MagnusonAudrey A Bassin  has presented today for surgery, with the diagnosis of Occlusion and stenosis of carotid artery without mention of cerebral infarction  The various methods of treatment have been discussed with the patient and family. After consideration of risks, benefits and other options for treatment, the patient has consented to  Procedure(s): ENDARTERECTOMY CAROTID (Left) as a surgical intervention .  The patient's history has been reviewed, patient examined, no change in status, stable for surgery.  I have reviewed the patient's chart and labs.  Questions were answered to the patient's satisfaction.     Beverely Suen S

## 2014-02-25 ENCOUNTER — Encounter (HOSPITAL_COMMUNITY): Payer: Self-pay | Admitting: Vascular Surgery

## 2014-02-25 LAB — CBC
HCT: 39.2 % (ref 36.0–46.0)
Hemoglobin: 12.4 g/dL (ref 12.0–15.0)
MCH: 30.6 pg (ref 26.0–34.0)
MCHC: 31.6 g/dL (ref 30.0–36.0)
MCV: 96.8 fL (ref 78.0–100.0)
Platelets: 164 10*3/uL (ref 150–400)
RBC: 4.05 MIL/uL (ref 3.87–5.11)
RDW: 13.9 % (ref 11.5–15.5)
WBC: 6.8 10*3/uL (ref 4.0–10.5)

## 2014-02-25 LAB — BASIC METABOLIC PANEL
Anion gap: 12 (ref 5–15)
BUN: 12 mg/dL (ref 6–23)
CHLORIDE: 102 meq/L (ref 96–112)
CO2: 26 mEq/L (ref 19–32)
CREATININE: 0.66 mg/dL (ref 0.50–1.10)
Calcium: 8.4 mg/dL (ref 8.4–10.5)
GFR calc Af Amer: >90 mL/min (ref >90)
GFR, EST NON AFRICAN AMERICAN: 86 mL/min — AB (ref >90)
Glucose, Bld: 110 mg/dL — ABNORMAL HIGH (ref 70–99)
Potassium: 3.6 mEq/L — ABNORMAL LOW (ref 3.7–5.3)
Sodium: 140 mEq/L (ref 137–147)

## 2014-02-25 MED ORDER — OXYCODONE-ACETAMINOPHEN 5-325 MG PO TABS: 1.0000 | ORAL_TABLET | Freq: Four times a day (QID) | ORAL | Status: DC | PRN

## 2014-02-25 NOTE — Progress Notes (Signed)
Does not want prescription for pain medication, has not asked for any pain med.  Wants to leave and go home does not want to wait for PA to come up and sign prescription. Discharged home with husband.  No complaints.

## 2014-02-25 NOTE — Care Management Note (Signed)
    Page 1 of 1   02/25/2014     11:09:41 AM CARE MANAGEMENT NOTE 02/25/2014  Patient:  Cristal FordGONYER,Adreena A   Account Number:  [id number]  Date Initiated:  02/25/2014  Documentation initiated by:  Donn PieriniWEBSTER,Daymion Nazaire  Subjective/Objective Assessment:   Pt admitted s/p carotid     Action/Plan:   PTA pt lived at home   Anticipated DC Date:  02/25/2014   Anticipated DC Plan:  HOME/SELF CARE         Choice offered to / List presented to:             Status of service:  Completed, signed off Medicare Important Message given?  NA - LOS <3 / Initial given by admissions (If response is "NO", the following Medicare IM given date fields will be blank) Date Medicare IM given:   Medicare IM given by:   Date Additional Medicare IM given:   Additional Medicare IM given by:    Discharge Disposition:  HOME/SELF CARE  Per UR Regulation:  Reviewed for med. necessity/level of care/duration of stay  If discussed at Long Length of Stay Meetings, dates discussed:    Comments:

## 2014-02-25 NOTE — Progress Notes (Signed)
   VASCULAR SURGERY ASSESSMENT & PLAN:  * 1 Day Post-Op s/p: Left CEA. Doing well.  *  Anticipate D/C later this AM  * VQI: On ASA and on a statin   SUBJECTIVE: A little nausea this AM, but better now  PHYSICAL EXAM: Filed Vitals:   02/24/14 2057 02/24/14 2100 02/24/14 2330 02/25/14 0330  BP:  102/37 103/46 130/51  Pulse: 68 66 71 70  Temp: 97.3 F (36.3 C)  98.2 F (36.8 C) 98.1 F (36.7 C)  TempSrc: Oral  Oral Oral  Resp: 18 18 16 18   Height:      Weight:      SpO2: 96% 97% 95% 100%   Neuro: intact Left neck incision looks fine.   LABS: Lab Results  Component Value Date   WBC 6.8 02/25/2014   HGB 12.4 02/25/2014   HCT 39.2 02/25/2014   MCV 96.8 02/25/2014   PLT 164 02/25/2014   Lab Results  Component Value Date   CREATININE 0.66 02/25/2014   Active Problems:   Carotid stenosis  Cari CarawayChris Dickson Beeper: [phone] 02/25/2014

## 2014-02-25 NOTE — Discharge Summary (Addendum)
[facility] Discharge Summary   Patient ID:  Annette Cooper MRN: [id number] DOB/AGE: [birth year] 73 y.o.  Admit date: 02/24/2014 Discharge date: 02/25/2014 Date of Surgery: 02/24/2014 Surgeon: Surgeon(s): Chuck Hint, MD  Admission Diagnosis: Occlusion and stenosis of carotid artery without mention of cerebral infarction  Discharge Diagnoses:  Occlusion and stenosis of carotid artery without mention of cerebral infarction  Secondary Diagnoses: Past Medical History  Diagnosis Date  . Hypertension   . Hyperlipidemia   . 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  . Depression     History   . Nodule of right lung 01/2002    CT SCAN.  Three nodules were noted on CT in 2001.  Marland Kitchen Shortness of breath   . Peripheral vascular disease   . CAD (coronary artery disease)   . Arthritis     ddd  . Complication of anesthesia   . PONV (postoperative nausea and vomiting)     Procedure(s): ENDARTERECTOMY CAROTID PATCH ANGIOPLASTY, Left Carotid Artery  Discharged Condition: good  HPI: 73 y.o. female who presented with 80-99% stenosis on the left and 40-59% stenosis on the right.  She has been asymptomatic and denies amaurosis fugax, weakness or numbness of either side of the body as well as speech difficulties. She states that every couple of months, she will see what she describes as a lightening bolt in the center of her vision that can last up to 20 minutes, but does not obstruct her vision.    Hospital Course:  [name] is a 73 y.o. female is S/P Left Procedure(s): ENDARTERECTOMY CAROTID PATCH ANGIOPLASTY, Left Carotid Artery She was admitted over night.  She has had mild nausea and was given Zofran prn with resolution.  She is drinking liquids without difficulty, voided several times independently.  Pending ambulation in the halls and eating solids she will be discharged home.  Her disposition is stable.     Significant Diagnostic Studies: CBC Lab Results  Component Value Date   WBC 6.8 02/25/2014   HGB 12.4 02/25/2014   HCT 39.2 02/25/2014   MCV 96.8 02/25/2014   PLT 164 02/25/2014    BMET    Component Value Date/Time   NA 140 02/25/2014 0415   K 3.6* 02/25/2014 0415   CL 102 02/25/2014 0415   CO2 26 02/25/2014 0415   GLUCOSE 110* 02/25/2014 0415   BUN 12 02/25/2014 0415   CREATININE 0.66 02/25/2014 0415   CALCIUM 8.4 02/25/2014 0415   GFRNONAA 86* 02/25/2014 0415   GFRAA >90 02/25/2014 0415   COAG Lab Results  Component Value Date   INR 1.00 02/24/2014   INR 0.95 02/06/2014     Disposition:  Discharge to :Home Discharge Instructions   Call MD for:  redness, tenderness, or signs of infection (pain, swelling, bleeding, redness, odor or green/yellow discharge around incision site)    Complete by:  As directed      Call MD for:  severe or increased pain, loss or decreased feeling  in affected limb(s)    Complete by:  As directed      Call MD for:  temperature >100.5    Complete by:  As directed      Discharge instructions    Complete by:  As directed   You may shower this afternoon, numbness along the incision is normal.     Driving Restrictions    Complete by:  As directed   No driving for 2  weeks     Increase activity slowly    Complete by:  As directed   Walk with assistance use walker or cane as needed     Lifting restrictions    Complete by:  As directed   No lifting for 6 weeks     Resume previous diet    Complete by:  As directed             Medication List         ALAWAY 0.025 % ophthalmic solution  Generic drug:  ketotifen  Place 1 drop into the left eye daily as needed (for allergies).     aspirin 81 MG tablet  Take 81 mg by mouth daily.     CALTRATE 600+D 600-400 MG-UNIT per tablet  Generic drug:  Calcium Carbonate-Vitamin D  Take 3 tablets by mouth daily.     chlorthalidone 25 MG tablet  Commonly known as:  HYGROTON  Take 1 tablet (25 mg total) by  mouth daily.     diphenhydramine-acetaminophen 25-500 MG Tabs  Commonly known as:  TYLENOL PM  Take 1 tablet by mouth at bedtime as needed (for sleep).     FLUoxetine 20 MG capsule  Commonly known as:  PROZAC  Take 40 mg by mouth daily.     lisinopril 20 MG tablet  Commonly known as:  PRINIVIL,ZESTRIL  Take 2 tablets (40 mg total) by mouth daily.     metoprolol succinate 50 MG 24 hr tablet  Commonly known as:  TOPROL-XL  Take 25 mg by mouth daily. Take with or immediately following a meal.     multivitamin tablet  Take 1 tablet by mouth daily.     oxyCODONE-acetaminophen 5-325 MG per tablet  Commonly known as:  PERCOCET/ROXICET  Take 1 tablet by mouth every 6 (six) hours as needed for moderate pain.     rosuvastatin 40 MG tablet  Commonly known as:  CRESTOR  Take 1 tablet (40 mg total) by mouth daily.       Verbal and written Discharge instructions given to the patient. Wound care per Discharge AVS     Follow-up Information   Follow up with DICKSON,CHRISTOPHER S, MD In 2 weeks. (sent message to office)    Specialty:  Vascular Surgery   Contact information:   [address] [place] [address] [phone]      The patients O2 SAT drops in the 80's with activity.  She will discharged on 1 L via [place] [facility].    SignedMosetta Pigeon 02/25/2014, 7:10 AM --- For VQI Registry use --- Instructions: Press F2 to tab through selections.  Delete question if not applicable.   Modified Rankin score at D/C (0-6): Rankin Score=0  IV medication needed for:  1. Hypertension: No 2. Hypotension: No  Post-op Complications: No  1. Post-op CVA or TIA: No  If yes: Event classification (right eye, left eye, right cortical, left cortical, verterobasilar, other):   If yes: Timing of event (intra-op, <6 hrs post-op, >=6 hrs post-op, unknown):   2. CN injury: No  If yes: CN  injuried   3. Myocardial infarction: No  If yes: Dx by (EKG or clinical, Troponin):   4.  CHF:  No  5.  Dysrhythmia (new): No  6. Wound infection: No  7. Reperfusion symptoms: No  8. Return to OR: No  If yes: return to OR for (bleeding, neurologic, other CEA incision, other):   Discharge medications: Statin use:  Yes ASA use:  Yes Beta blocker use:  Yes ACE-Inhibitor use:  Yes P2Y12 Antagonist use: [x ] None, [ ]  Plavix, [ ]  Plasugrel, [ ]  Ticlopinine, [ ]  Ticagrelor, [ ]  Other, [ ]  No for medical reason, [ ]  Non-compliant, [ ]  Not-indicated Anti-coagulant use:  [x ] None, [ ]  Warfarin, [ ]  Rivaroxaban, [ ]  Dabigatran, [ ]  Other, [ ]  No for medical reason, [ ]  Non-compliant, [ ]  Not-indicated

## 2014-02-25 NOTE — Progress Notes (Signed)
SATURATION QUALIFICATIONS: (This note is used to comply with regulatory documentation for home oxygen)  Patient Saturations on Room Air at Rest = 85%  Patient Saturations on Room Air while Ambulating = 80%  Patient Saturations on2  Liters of oxygen while Ambulating = 96  Please briefly explain why patient needs home oxygen: Desats on r/a while ambulating and at rest.

## 2014-02-25 NOTE — Progress Notes (Signed)
Discharge instructions given to patient and husband with teachback, all questions answered.  No complaints. Awaiting oxygen to be delivered for home use.

## 2014-02-25 NOTE — Discharge Summary (Signed)
Agree with plans for discharge.  Waverly Ferrarihristopher Amilcar Reever, MD, FACS Beeper [phone] 02/25/2014

## 2014-02-26 NOTE — Discharge Summary (Signed)
Agree with plans for D/C.  Waverly Ferrarihristopher Kelin Borum, MD, FACS Beeper [phone] 02/26/2014

## 2014-03-17 ENCOUNTER — Encounter: Payer: Self-pay | Admitting: Vascular Surgery

## 2014-03-18 ENCOUNTER — Encounter: Payer: Self-pay | Admitting: Vascular Surgery

## 2014-03-18 ENCOUNTER — Ambulatory Visit (INDEPENDENT_AMBULATORY_CARE_PROVIDER_SITE_OTHER): Payer: Medicare Other | Admitting: Vascular Surgery

## 2014-03-18 VITALS — BP 123/76 | HR 87 | Resp 18 | Ht 61.0 in | Wt 210.0 lb

## 2014-03-18 DIAGNOSIS — I6529 Occlusion and stenosis of unspecified carotid artery: Secondary | ICD-10-CM

## 2014-03-18 DIAGNOSIS — Z48812 Encounter for surgical aftercare following surgery on the circulatory system: Secondary | ICD-10-CM

## 2014-03-18 NOTE — Addendum Note (Signed)
Addended by: Sharee Pimple on: 03/18/2014 04:09 PM   Modules accepted: Orders

## 2014-03-18 NOTE — Progress Notes (Signed)
  [facility] PATIENT  Patient name: Annette Cooper MRN: [id number] DOB: 1940-11-05 Sex: female  REASON FOR VISIT: Follow up after left carotid endarterectomy  HPI: Annette Cooper is a 73 y.o. female who was found to have a left carotid bruit. This prompted a duplex scan which showed a greater than 80% left carotid stenosis. She underwent an elective left carotid endarterectomy on 02/24/2014. She did well postoperatively and was discharged on postoperative day #1. She presents for her first outpatient visit. She denies any specific problems. Specifically she denies focal weakness or paresthesias. She was on home O2 for less than a week and is now off of this.  Her preoperative duplex showed a 40-59% right carotid stenosis.  REVIEW OF SYSTEMS: Arly.Keller ] denotes positive finding; [  ] denotes negative finding  CARDIOVASCULAR:   chest pain    dyspnea on exertion    CONSTITUTIONAL:   fever    chills  PHYSICAL EXAM: Filed Vitals:   03/18/14 0837 03/18/14 0840  BP: 143/50 123/76  Pulse: 92 87  Resp: 18   Height:  (1.549 m)   Weight: 210 lb (95.255 kg)    Body mass index is 39.7 kg/(m^2). GENERAL: The patient is a well-nourished female, in no acute distress. The vital signs are documented above. CARDIOVASCULAR: There is a regular rate and rhythm. PULMONARY: There is good air exchange bilaterally without wheezing or rales. Her left neck incision is healing nicely. NEURO: She has no focal weakness or paresthesias.  MEDICAL ISSUES:  Occlusion and stenosis of carotid artery without mention of cerebral infarction The patient is doing well status post left carotid endarterectomy. She has a 40-59% right carotid stenosis. I have ordered a follow duplex scan in 6 months and I will see her back at that time. If there is no progression at that time we can probably see her on a yearly basis. Of note she is on aspirin and is on a statin.   Annette Cooper S [facility]  [place] Beeper: [phone]

## 2014-03-18 NOTE — Assessment & Plan Note (Signed)
The patient is doing well status post left carotid endarterectomy. She has a 40-59% right carotid stenosis. I have ordered a follow duplex scan in 6 months and I will see her back at that time. If there is no progression at that time we can probably see her on a yearly basis. Of note she is on aspirin and is on a statin.

## 2014-03-23 ENCOUNTER — Ambulatory Visit: Payer: Medicare Other | Admitting: Cardiology

## 2014-04-29 ENCOUNTER — Other Ambulatory Visit: Payer: Self-pay | Admitting: *Deleted

## 2014-04-29 ENCOUNTER — Telehealth: Payer: Self-pay | Admitting: Cardiology

## 2014-04-29 DIAGNOSIS — I10 Essential (primary) hypertension: Secondary | ICD-10-CM

## 2014-04-29 NOTE — Telephone Encounter (Signed)
Forwarding to Dr. Wyline MoodBranch

## 2014-04-29 NOTE — Telephone Encounter (Signed)
Mrs. Annette Cooper calls stating that she is having cramps in her calves when she walks. Wants to know if the CHLORTHALIDONE 25 MG PO TABS Is causing the cramping.

## 2014-04-29 NOTE — Telephone Encounter (Signed)
Patient informed. [facility] I will fax orders to their office.  Fax # given by Dr. Charlestine NightAaron's office : [phone]

## 2014-04-29 NOTE — Telephone Encounter (Signed)
It could be, likely if her potassium or her magnesium became low due to the medication. Please order a BMET and Mg level for her.   Dominga FerryJ Makinsley Schiavi MD

## 2014-05-06 ENCOUNTER — Telehealth: Payer: Self-pay | Admitting: *Deleted

## 2014-05-06 NOTE — Telephone Encounter (Signed)
Message copied by Eustace MooreANDERSON, Cecile Gillispie M on Wed May 06, 2014  1:39 PM ------      Message from: Dina RichBRANCH, JONATHAN F      Created: Mon May 04, 2014 10:19 AM       Labs look good, continue current meds            Dominga FerryJ Branch MD ------

## 2014-05-06 NOTE — Telephone Encounter (Signed)
Patient informed. 

## 2014-05-21 ENCOUNTER — Encounter: Payer: Self-pay | Admitting: Cardiology

## 2014-05-21 ENCOUNTER — Ambulatory Visit (INDEPENDENT_AMBULATORY_CARE_PROVIDER_SITE_OTHER): Payer: Medicare Other | Admitting: Cardiology

## 2014-05-21 VITALS — BP 138/78 | HR 69 | Ht 61.0 in | Wt 204.0 lb

## 2014-05-21 DIAGNOSIS — I6523 Occlusion and stenosis of bilateral carotid arteries: Secondary | ICD-10-CM

## 2014-05-21 DIAGNOSIS — I2510 Atherosclerotic heart disease of native coronary artery without angina pectoris: Secondary | ICD-10-CM

## 2014-05-21 DIAGNOSIS — I10 Essential (primary) hypertension: Secondary | ICD-10-CM

## 2014-05-21 DIAGNOSIS — I6529 Occlusion and stenosis of unspecified carotid artery: Secondary | ICD-10-CM

## 2014-05-21 DIAGNOSIS — M79606 Pain in leg, unspecified: Secondary | ICD-10-CM

## 2014-05-21 MED ORDER — AMLODIPINE BESYLATE 10 MG PO TABS: 10.0000 mg | ORAL_TABLET | Freq: Every day | ORAL | Status: DC

## 2014-05-21 NOTE — Progress Notes (Signed)
Clinical Summary Annette Cooper is a 73 y.o.female seen today for follow up of the following medical problems.   1. HTN  - compliant with medications.  - recently has had some leg cramps that started around the time we started chlorthalidone, we checked a K and Mg which were normal. Cramps described in bilateral calves, occurs only with walking, relieved with rest.   2. Carotid stenosis - s/p left CEA 02/24/2014 which she recovered well from. She has some right sided disease that is being followed by vascular with plans for repeat carotid US in next few months.  - denies any recent neuro symptoms.   3. CAD -prior CABG in 2005 (SVG-LAD, SVG-ramus, SVG-RCA) - LVEF 10/2008 LVEF 50-55% - denies any chest pain, reports stable chronic DOE that is unchanged.    4. Hyperlipidemia - compliant with statin - last panel 03/2014 TC 179, TG 279 HDL 44 LDL 79 - Past Medical History  Diagnosis Date  . Hypertension   . Hyperlipidemia   . 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  . Depression     History   . Nodule of right lung 01/2002    CT SCAN.  Three nodules were noted on CT in 2001.  Marland Kitchen Shortness of breath   . Peripheral vascular disease   . CAD (coronary artery disease)   . Arthritis     ddd  . Complication of anesthesia   . PONV (postoperative nausea and vomiting)      Allergies  Allergen Reactions  . Bacitracin-Polymyxin B Swelling and Rash  . Neomycin-Bacitracin Zn-Polymyx Swelling and Rash  . Polysorbate Swelling and Rash    Polysporins, etc     Current Outpatient Prescriptions  Medication Sig Dispense Refill  . aspirin 81 MG tablet Take 81 mg by mouth daily.     . Calcium Carbonate-Vitamin D (CALTRATE 600+D) 600-400 MG-UNIT per tablet Take 3 tablets by mouth daily.     . chlorthalidone (HYGROTON) 25 MG tablet Take 1 tablet (25 mg total) by mouth daily. 30 tablet 3  . diphenhydramine-acetaminophen (TYLENOL PM) 25-500 MG TABS  Take 1 tablet by mouth at bedtime as needed (for sleep).     Marland Kitchen FLUoxetine (PROZAC) 20 MG capsule Take 40 mg by mouth daily.     Marland Kitchen ketotifen (ALAWAY) 0.025 % ophthalmic solution Place 1 drop into the left eye daily as needed (for allergies).    Marland Kitchen lisinopril (PRINIVIL,ZESTRIL) 20 MG tablet Take 2 tablets (40 mg total) by mouth daily.    . metoprolol succinate (TOPROL-XL) 50 MG 24 hr tablet Take 25 mg by mouth daily. Take with or immediately following a meal.    . Multiple Vitamin (MULTIVITAMIN) tablet Take 1 tablet by mouth daily.      Marland Kitchen oxyCODONE-acetaminophen (PERCOCET/ROXICET) 5-325 MG per tablet Take 1 tablet by mouth every 6 (six) hours as needed for moderate pain. 20 tablet 0  . rosuvastatin (CRESTOR) 40 MG tablet Take 1 tablet (40 mg total) by mouth daily. 90 tablet 3   No current facility-administered medications for this visit.     Past Surgical History  Procedure Laterality Date  . Coronary artery bypass graft  2005    (saphenous vein graft to the  left anterior descending, saphenous vein graft to the ramus intermedius     saphenous vein graft to the right coronary artery  . Re-excision prepatellar bursa      Left Knee  . Hammer toe surgery  Right second toe  . Wrist surgery      Right  . Tubal ligation    . Endarterectomy Left 02/24/2014    Procedure: ENDARTERECTOMY CAROTID;  Surgeon: Chuck Hinthristopher S Dickson, MD;  [facility];  Service: Vascular;  Laterality: Left;  . Patch angioplasty Left 02/24/2014    Procedure: PATCH ANGIOPLASTY, Left Carotid Artery;  Surgeon: Chuck Hinthristopher S Dickson, MD;  [facility];  Service: Vascular;  Laterality: Left;     Allergies  Allergen Reactions  . Bacitracin-Polymyxin B Swelling and Rash  . Neomycin-Bacitracin Zn-Polymyx Swelling and Rash  . Polysorbate Swelling and Rash    Polysporins, etc      Family History  Problem Relation Age of Onset  . Diabetes Mother   . Hyperlipidemia Mother   . Hypertension Mother   . Peripheral  vascular disease Mother     amputation  . Diabetes Father   . Heart disease Father     beofre age 260  . Hyperlipidemia Father   . Hypertension Father   . Heart attack Father   . Diabetes Sister   . Hyperlipidemia Sister   . Hypertension Sister   . Diabetes Brother   . Heart disease Brother     before age 73  . Hyperlipidemia Brother   . Hypertension Brother   . Peripheral vascular disease Brother      Social History Annette Cooper reports that she quit smoking about 10 years ago. Her smoking use included Cigarettes. She has a 50 pack-year smoking history. She has never used smokeless tobacco. Annette Cooper reports that she does not drink alcohol.   Review of Systems CONSTITUTIONAL: No weight loss, fever, chills, weakness or fatigue.  HEENT: Eyes: No visual loss, blurred vision, double vision or yellow sclerae.No hearing loss, sneezing, congestion, runny nose or sore throat.  SKIN: No rash or itching.  CARDIOVASCULAR: per HPI RESPIRATORY: No shortness of breath, cough or sputum.  GASTROINTESTINAL: No anorexia, nausea, vomiting or diarrhea. No abdominal pain or blood.  GENITOURINARY: No burning on urination, no polyuria NEUROLOGICAL: No headache, dizziness, syncope, paralysis, ataxia, numbness or tingling in the extremities. No change in bowel or bladder control.  MUSCULOSKELETAL: leg cramps LYMPHATICS: No enlarged nodes. No history of splenectomy.  PSYCHIATRIC: No history of depression or anxiety.  ENDOCRINOLOGIC: No reports of sweating, cold or heat intolerance. No polyuria or polydipsia.  Marland Kitchen.   Physical Examination p 69 bp 138/78 Wt 204 lbs BMI 39 Gen: resting comfortably, no acute distress HEENT: no scleral icterus, pupils equal round and reactive, no palptable cervical adenopathy,  CV: RRR, no m/r/g, no JVD, +bilateral carotid bruits. 2+ bilateral DP pulses, diminshed bilateral PT Resp: Clear to auscultation bilaterally GI: abdomen is soft, non-tender, non-distended, normal  bowel sounds, no hepatosplenomegaly MSK: extremities are warm, no edema.  Skin: warm, no rash Neuro:  no focal deficits Psych: appropriate affect   Diagnostic Studies 10/2012 Echo LVEF 50-55%, grade II diastolic dysfunction  12/2011 Exercise MPI No ischemia  Assessment and Plan  1. HTN  -at goal, describes some leg cramps that may be related to thiazide diuretic. Will hold for now, start norvasc 10mg  daily. - she is to call with bp's in 3 weeks and also update on leg cramps.   2. Leg cramps/pain - unclear etiology, potentially related to chlorthalidone. Normal K and Mg. Symptoms suggestive of possible claudication as well especially given her cardiovascular history. Follow symptoms off chlorthalidone, if persist will obtain ABIs.   2. CAD - no current symptoms,  continue risk factor modification and secondary preventoin   3. Hyperlipidemia - continue high dose statin  4. Carotis stenosis - s/p left CEA, monitoring right sided disease. She will continue to follow with vascular.    F/u 6 months   Antoine PocheJonathan F. Branch, M.D.

## 2014-05-21 NOTE — Patient Instructions (Signed)
   Stop Chlorthalidone  Begin Norvasc 10mg  daily - new sent to pharm Continue all other medications.   Your physician has requested that you regularly monitor and record your blood pressure readings at home. Please check 3-4 x per week over the next 3 weeks and return to office for MD review.   Your physician wants you to follow up in: 6 months.  You will receive a reminder letter in the mail one-two months in advance.  If you don't receive a letter, please call our office to schedule the follow up appointment

## 2014-06-29 ENCOUNTER — Encounter: Payer: Self-pay | Admitting: *Deleted

## 2014-06-29 ENCOUNTER — Telehealth: Payer: Self-pay | Admitting: *Deleted

## 2014-06-29 NOTE — Telephone Encounter (Signed)
Patient notified of results by letter  

## 2014-06-29 NOTE — Telephone Encounter (Signed)
-----   Message from Antoine PocheJonathan F Branch, MD sent at 06/26/2014  1:03 PM EST ----- BP log reviewed, numbers overall look good. Continue current meds  Dominga FerryJ Branch MD

## 2014-07-23 ENCOUNTER — Encounter (HOSPITAL_COMMUNITY): Payer: Self-pay | Admitting: Vascular Surgery

## 2014-09-15 ENCOUNTER — Encounter: Payer: Self-pay | Admitting: Vascular Surgery

## 2014-09-16 ENCOUNTER — Ambulatory Visit (INDEPENDENT_AMBULATORY_CARE_PROVIDER_SITE_OTHER): Payer: Medicare Other | Admitting: Vascular Surgery

## 2014-09-16 ENCOUNTER — Encounter: Payer: Self-pay | Admitting: Vascular Surgery

## 2014-09-16 ENCOUNTER — Ambulatory Visit (HOSPITAL_COMMUNITY)
Admission: RE | Admit: 2014-09-16 | Discharge: 2014-09-16 | Disposition: A | Discharge status: Home / Self Care | Payer: Medicare Other | Source: Ambulatory Visit | Attending: Vascular Surgery | Admitting: Vascular Surgery

## 2014-09-16 VITALS — BP 120/44 | HR 51 | Ht 61.0 in | Wt 207.5 lb

## 2014-09-16 DIAGNOSIS — I6522 Occlusion and stenosis of left carotid artery: Secondary | ICD-10-CM

## 2014-09-16 DIAGNOSIS — I6523 Occlusion and stenosis of bilateral carotid arteries: Secondary | ICD-10-CM

## 2014-09-16 DIAGNOSIS — Z48812 Encounter for surgical aftercare following surgery on the circulatory system: Secondary | ICD-10-CM | POA: Insufficient documentation

## 2014-09-16 NOTE — Progress Notes (Signed)
[facility] PATIENT  Patient name: Annette Cooper MRN: [id number] DOB: [birth year] Sex: female  REASON FOR VISIT: follow up of carotid disease.  HPI: Annette Cooper is a 74 y.o. female who presented with a greater than 80% left carotid stenosis and underwent a left carotid endarterectomy bovine pericardial patch angioplasty on 02/14/2014. She comes in for a 6 month follow up visit. She denies any history of stroke, TIAs, expressive or receptive aphasia, or amaurosis fugax.  He is on aspirin. She is on a statin.  Past Medical History  Diagnosis Date  . Hypertension   . Hyperlipidemia   . 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  . Depression     History   . Nodule of right lung 01/2002    CT SCAN.  Three nodules were noted on CT in 2001.  Marland Kitchen Shortness of breath   . Peripheral vascular disease   . CAD (coronary artery disease)   . Arthritis     ddd  . Complication of anesthesia   . PONV (postoperative nausea and vomiting)    Family History  Problem Relation Age of Onset  . Diabetes Mother   . Hyperlipidemia Mother   . Hypertension Mother   . Peripheral vascular disease Mother     amputation  . Diabetes Father   . Heart disease Father     beofre age 19  . Hyperlipidemia Father   . Hypertension Father   . Heart attack Father   . Diabetes Sister   . Hyperlipidemia Sister   . Hypertension Sister   . Heart disease Sister   . Diabetes Brother   . Heart disease Brother     before age 81  . Hyperlipidemia Brother   . Hypertension Brother   . Peripheral vascular disease Brother   . Heart attack Brother    SOCIAL HISTORY: History  Substance Use Topics  . Smoking status: Former Smoker -- 1.00 packs/day for 50 years    Types: Cigarettes    Quit date: 08/13/2003  . Smokeless tobacco: Never Used  . Alcohol Use: No   Allergies  Allergen Reactions  . Bacitracin-Polymyxin B Swelling and Rash  .  Neomycin-Bacitracin Zn-Polymyx Swelling and Rash  . Polysorbate Swelling and Rash    Polysporins, etc   Current Outpatient Prescriptions  Medication Sig Dispense Refill  . amLODipine (NORVASC) 10 MG tablet Take 1 tablet (10 mg total) by mouth daily. 30 tablet 6  . aspirin 81 MG tablet Take 81 mg by mouth daily.     . Calcium Carbonate-Vitamin D (CALTRATE 600+D) 600-400 MG-UNIT per tablet Take 3 tablets by mouth daily.     . diphenhydramine-acetaminophen (TYLENOL PM) 25-500 MG TABS Take 1 tablet by mouth at bedtime as needed (for sleep).     Marland Kitchen FLUoxetine (PROZAC) 20 MG capsule Take 40 mg by mouth daily.     Marland Kitchen lisinopril (PRINIVIL,ZESTRIL) 20 MG tablet Take 2 tablets (40 mg total) by mouth daily.    . metoprolol succinate (TOPROL-XL) 50 MG 24 hr tablet Take 25 mg by mouth daily. Take with or immediately following a meal.    . Multiple Vitamin (MULTIVITAMIN) tablet Take 1 tablet by mouth daily.      . rosuvastatin (CRESTOR) 40 MG tablet Take 1 tablet (40 mg total) by mouth daily. 90 tablet 3  . ketotifen (ALAWAY) 0.025 % ophthalmic solution Place 1 drop into the left eye daily as needed (  for allergies).     No current facility-administered medications for this visit.   REVIEW OF SYSTEMS: Arly.Keller[X ] denotes positive finding; [  ] denotes negative finding  CARDIOVASCULAR:  [ ]  chest pain   [ ]  chest pressure   [ ]  palpitations   [ ]  orthopnea   [ ]  dyspnea on exertion   [ ]  claudication   [ ]  rest pain   [ ]  DVT   [ ]  phlebitis PULMONARY:   [ ]  productive cough   [ ]  asthma   [ ]  wheezing NEUROLOGIC:   [ ]  weakness  [ ]  paresthesias  [ ]  aphasia  [ ]  amaurosis  [ ]  dizziness HEMATOLOGIC:   [ ]  bleeding problems   [ ]  clotting disorders MUSCULOSKELETAL:  [ ]  joint pain   [ ]  joint swelling [ ]  leg swelling GASTROINTESTINAL: [ ]   blood in stool  [ ]   hematemesis GENITOURINARY:  [ ]   dysuria  [ ]   hematuria PSYCHIATRIC:  [ ]  history of major depression INTEGUMENTARY:  [ ]  rashes  [ ]   ulcers CONSTITUTIONAL:  [ ]  fever   [ ]  chills  PHYSICAL EXAM: Filed Vitals:   09/16/14 1024 09/16/14 1027  BP: 138/47 120/44  Pulse: 51   Height: 5\' 1"  (1.549 m)   Weight: 207 lb 8 oz (94.121 kg)   SpO2: 96%    Body mass index is 39.23 kg/(m^2). GENERAL: The patient is a well-nourished female, in no acute distress. The vital signs are documented above. CARDIOVASCULAR: There is a regular rate and rhythm. I do not detect carotid bruits. PULMONARY: There is good air exchange bilaterally without wheezing or rales. ABDOMEN: Soft and non-tender with normal pitched bowel sounds.  MUSCULOSKELETAL: There are no major deformities or cyanosis. NEUROLOGIC: No focal weakness or paresthesias are detected. SKIN: There are no ulcers or rashes noted. PSYCHIATRIC: The patient has a normal affect.  DATA:  I have independently interpreted her carotid duplex scan which shows no evidence of recurrent carotid stenosis on the left. The velocities on the right have improved and the stenosis is now less than 40%.  MEDICAL ISSUES: CAROTID DISEASE: The patient is doing well status post left carotid endarterectomy in August 2015. There is no evidence of recurrent carotid stenosis. She has a less than 40% right carotid stenosis. I have ordered a follow up carotid duplex scan in 1 year and we will see her back at that time. In the meantime she remains on aspirin and is also on a statin. She knows to call sooner she has problems.   Return in about 1 year (around 09/16/2015).  DICKSON,CHRISTOPHER S [facility] [place] Beeper: [phone]

## 2014-09-17 NOTE — Addendum Note (Signed)
Addended by: Sharee PimpleMCCHESNEY, Shalinda Burkholder K on: 09/17/2014 03:06 PM   Modules accepted: Orders

## 2015-05-05 ENCOUNTER — Encounter: Payer: Self-pay | Admitting: *Deleted

## 2015-05-05 ENCOUNTER — Encounter: Payer: Self-pay | Admitting: Cardiology

## 2015-05-05 ENCOUNTER — Ambulatory Visit (INDEPENDENT_AMBULATORY_CARE_PROVIDER_SITE_OTHER): Payer: Medicare Other | Admitting: Cardiology

## 2015-05-05 VITALS — BP 120/73 | HR 61 | Ht 61.0 in | Wt 181.0 lb

## 2015-05-05 DIAGNOSIS — I6523 Occlusion and stenosis of bilateral carotid arteries: Secondary | ICD-10-CM

## 2015-05-05 DIAGNOSIS — I2510 Atherosclerotic heart disease of native coronary artery without angina pectoris: Secondary | ICD-10-CM

## 2015-05-05 DIAGNOSIS — E785 Hyperlipidemia, unspecified: Secondary | ICD-10-CM | POA: Diagnosis not present

## 2015-05-05 DIAGNOSIS — I10 Essential (primary) hypertension: Secondary | ICD-10-CM | POA: Diagnosis not present

## 2015-05-05 NOTE — Progress Notes (Signed)
Patient ID: Denzil MagnusonAudrey A Morgano, female   DOB: [birth year], 74 y.o.   MRN: [id number]     Clinical Summary Ms. Sindy GuadeloupeGonyer is a 74 y.o.female seen today for follow up of the following medical problems.   1. HTN  - compliant with medications.  - does not check regularly at home - leg cramps resolved off chlorthalidone.     2. Carotid stenosis - s/p left CEA 02/24/2014 which she recovered well from. She has some right sided disease that is being followed by vascular with plans for repeat carotid US in next few months.  - denies any recent neuro symptoms.   3. CAD -prior CABG in 2005 (SVG-LAD, SVG-ramus, SVG-RCA) - LVEF 10/2008 LVEF 50-55%  - denies any chest pain, reports stable chronic DOE that is unchanged.    4. Hyperlipidemia - compliant with statin - last panel 03/2014 TC 179, TG 279 HDL 44 LDL 79 Past Medical History  Diagnosis Date  . Hypertension   . Hyperlipidemia   . 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  . Depression     History   . Nodule of right lung 01/2002    CT SCAN.  Three nodules were noted on CT in 2001.  Marland Kitchen. Shortness of breath   . Peripheral vascular disease   . CAD (coronary artery disease)   . Arthritis     ddd  . Complication of anesthesia   . PONV (postoperative nausea and vomiting)      Allergies  Allergen Reactions  . Bacitracin-Polymyxin B Swelling and Rash  . Neomycin-Bacitracin Zn-Polymyx Swelling and Rash  . Polysorbate Swelling and Rash    Polysporins, etc     Current Outpatient Prescriptions  Medication Sig Dispense Refill  . amLODipine (NORVASC) 10 MG tablet Take 1 tablet (10 mg total) by mouth daily. 30 tablet 6  . aspirin 81 MG tablet Take 81 mg by mouth daily.     . Calcium Carbonate-Vitamin D (CALTRATE 600+D) 600-400 MG-UNIT per tablet Take 3 tablets by mouth daily.     . diphenhydramine-acetaminophen (TYLENOL PM) 25-500 MG TABS Take 1 tablet by mouth at bedtime as needed (for sleep).      Marland Kitchen. FLUoxetine (PROZAC) 20 MG capsule Take 40 mg by mouth daily.     Marland Kitchen. ketotifen (ALAWAY) 0.025 % ophthalmic solution Place 1 drop into the left eye daily as needed (for allergies).    Marland Kitchen. lisinopril (PRINIVIL,ZESTRIL) 20 MG tablet Take 2 tablets (40 mg total) by mouth daily.    . metoprolol succinate (TOPROL-XL) 50 MG 24 hr tablet Take 25 mg by mouth daily. Take with or immediately following a meal.    . Multiple Vitamin (MULTIVITAMIN) tablet Take 1 tablet by mouth daily.      . rosuvastatin (CRESTOR) 40 MG tablet Take 1 tablet (40 mg total) by mouth daily. 90 tablet 3   No current facility-administered medications for this visit.     Past Surgical History  Procedure Laterality Date  . Coronary artery bypass graft  2005    (saphenous vein graft to the  left anterior descending, saphenous vein graft to the ramus intermedius     saphenous vein graft to the right coronary artery  . Re-excision prepatellar bursa      Left Knee  . Hammer toe surgery      Right second toe  . Wrist surgery      Right  . Tubal ligation    . Endarterectomy Left 02/24/2014  Procedure: ENDARTERECTOMY CAROTID;  Surgeon: Chuck Hint, MD;  Location: [facility] OR;  Service: Vascular;  Laterality: Left;  . Patch angioplasty Left 02/24/2014    Procedure: PATCH ANGIOPLASTY, Left Carotid Artery;  Surgeon: Chuck Hint, MD;  Location: [facility] OR;  Service: Vascular;  Laterality: Left;     Allergies  Allergen Reactions  . Bacitracin-Polymyxin B Swelling and Rash  . Neomycin-Bacitracin Zn-Polymyx Swelling and Rash  . Polysorbate Swelling and Rash    Polysporins, etc      Family History  Problem Relation Age of Onset  . Diabetes Mother   . Hyperlipidemia Mother   . Hypertension Mother   . Peripheral vascular disease Mother     amputation  . Diabetes Father   . Heart disease Father     beofre age 33  . Hyperlipidemia Father   . Hypertension Father   . Heart attack Father   . Diabetes Sister   .  Hyperlipidemia Sister   . Hypertension Sister   . Heart disease Sister   . Diabetes Brother   . Heart disease Brother     before age 59  . Hyperlipidemia Brother   . Hypertension Brother   . Peripheral vascular disease Brother   . Heart attack Brother      Social History Ms. Goatley reports that she quit smoking about 11 years ago. Her smoking use included Cigarettes. She has a 50 pack-year smoking history. She has never used smokeless tobacco. Ms. Fraleigh reports that she does not drink alcohol.   Review of Systems CONSTITUTIONAL: No weight loss, fever, chills, weakness or fatigue.  HEENT: Eyes: No visual loss, blurred vision, double vision or yellow sclerae.No hearing loss, sneezing, congestion, runny nose or sore throat.  SKIN: No rash or itching.  CARDIOVASCULAR: per HPI RESPIRATORY: No shortness of breath, cough or sputum.  GASTROINTESTINAL: No anorexia, nausea, vomiting or diarrhea. No abdominal pain or blood.  GENITOURINARY: No burning on urination, no polyuria NEUROLOGICAL: No headache, dizziness, syncope, paralysis, ataxia, numbness or tingling in the extremities. No change in bowel or bladder control.  MUSCULOSKELETAL: No muscle, back pain, joint pain or stiffness.  LYMPHATICS: No enlarged nodes. No history of splenectomy.  PSYCHIATRIC: No history of depression or anxiety.  ENDOCRINOLOGIC: No reports of sweating, cold or heat intolerance. No polyuria or polydipsia.  Marland Kitchen   Physical Examination Filed Vitals:   05/05/15 1420  BP: 120/73  Pulse: 61   Filed Vitals:   05/05/15 1420  Height:  (1.549 m)  Weight: 181 lb (82.101 kg)    Gen: resting comfortably, no acute distress HEENT: no scleral icterus, pupils equal round and reactive, no palptable cervical adenopathy,  CV: RRR, no m/r/g, no jvd Resp: Clear to auscultation bilaterally GI: abdomen is soft, non-tender, non-distended, normal bowel sounds, no hepatosplenomegaly MSK: extremities are warm, no edema.    Skin: warm, no rash Neuro:  no focal deficits Psych: appropriate affect   Diagnostic Studies 09/2014 Carotid US Patent left CEA, <40% RICA disease.    10/2012 Echo LVEF 50-55%, grade II diastolic dysfunction  12/2011 Exercise MPI No ischemia  Assessment and Plan  1. HTN  -at goal, continue current meds   2. CAD - no current symptoms, continue risk factor modification and secondary preventoin  3. Hyperlipidemia - continue high dose statin - request labs from pcp  4. Carotis stenosis - s/p left CEA, monitoring right sided disease. She will continue to follow with vascular.    F/u 1 year  Arnoldo Lenis, M.D

## 2015-05-05 NOTE — Patient Instructions (Signed)
Your physician wants you to follow-up in: 1 YEAR WITH DR. BRANCH You will receive a reminder letter in the mail two months in advance. If you don't receive a letter, please call our office to schedule the follow-up appointment.  Your physician recommends that you continue on your current medications as directed. Please refer to the Current Medication list given to you today.  WE WILL REQUEST LABS  Thank you for choosing [facility]!!    

## 2015-09-17 ENCOUNTER — Encounter: Payer: Self-pay | Admitting: Family

## 2015-09-22 ENCOUNTER — Ambulatory Visit (HOSPITAL_COMMUNITY)
Admission: RE | Admit: 2015-09-22 | Discharge: 2015-09-22 | Disposition: A | Discharge status: Home / Self Care | Payer: Medicare Other | Source: Ambulatory Visit | Attending: Family | Admitting: Family

## 2015-09-22 ENCOUNTER — Encounter: Payer: Self-pay | Admitting: Family

## 2015-09-22 ENCOUNTER — Ambulatory Visit (INDEPENDENT_AMBULATORY_CARE_PROVIDER_SITE_OTHER): Payer: Medicare Other | Admitting: Family

## 2015-09-22 VITALS — BP 116/71 | HR 54 | Temp 97.1°F | Resp 16 | Ht 60.0 in | Wt 178.0 lb

## 2015-09-22 DIAGNOSIS — I6523 Occlusion and stenosis of bilateral carotid arteries: Secondary | ICD-10-CM

## 2015-09-22 DIAGNOSIS — Z48812 Encounter for surgical aftercare following surgery on the circulatory system: Secondary | ICD-10-CM

## 2015-09-22 DIAGNOSIS — I708 Atherosclerosis of other arteries: Secondary | ICD-10-CM

## 2015-09-22 DIAGNOSIS — I10 Essential (primary) hypertension: Secondary | ICD-10-CM | POA: Insufficient documentation

## 2015-09-22 DIAGNOSIS — I6522 Occlusion and stenosis of left carotid artery: Secondary | ICD-10-CM

## 2015-09-22 DIAGNOSIS — I771 Stricture of artery: Secondary | ICD-10-CM

## 2015-09-22 DIAGNOSIS — Z9889 Other specified postprocedural states: Secondary | ICD-10-CM | POA: Diagnosis not present

## 2015-09-22 DIAGNOSIS — E785 Hyperlipidemia, unspecified: Secondary | ICD-10-CM | POA: Insufficient documentation

## 2015-09-22 NOTE — Progress Notes (Signed)
Chief Complaint: Extracranial Carotid Artery Stenosis   History of Present Illness  Annette Cooper is a 75 y.o. female patient of Dr. Edilia Bo who presented with a greater than 80% left carotid stenosis and underwent a left carotid endarterectomy with bovine pericardial patch angioplasty on 02/14/2014. She comes in for a 1 year follow up visit. She denies any history of stroke, TIAs, expressive or receptive aphasia, or amaurosis fugax.  He is on aspirin. She is on a statin.  She denies any steal sx's in her right upper extremity: denies tingling, numbness, pain, or cold sensation. She uses a cane to steady herself.   She had an MI and a CABG in 2005.  The patient denies New Medical or Surgical History. She lost 29 pounds since she was here a year ago; has been in a weight loss program.   She takes chlorthalidone 25 mg daily.   Pt Diabetic: no Pt smoker: former smoker, quit in 2005  Pt meds include: Statin : yes ASA: yes Other anticoagulants/antiplatelets: no   Past Medical History  Diagnosis Date  . Hypertension   . Hyperlipidemia   . 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  . Depression     History   . Nodule of right lung 01/2002    CT SCAN.  Three nodules were noted on CT in 2001.  Marland Kitchen Shortness of breath   . Peripheral vascular disease (HCC)   . CAD (coronary artery disease)   . Arthritis     ddd  . Complication of anesthesia   . PONV (postoperative nausea and vomiting)     Social History Social History  Substance Use Topics  . Smoking status: Former Smoker -- 1.00 packs/day for 50 years    Types: Cigarettes    Quit date: 08/13/2003  . Smokeless tobacco: Never Used  . Alcohol Use: No    Family History Family History  Problem Relation Age of Onset  . Diabetes Mother   . Hyperlipidemia Mother   . Hypertension Mother   . Peripheral vascular disease Mother     amputation  . Diabetes Father   . Heart disease Father      beofre age 86  . Hyperlipidemia Father   . Hypertension Father   . Heart attack Father   . Diabetes Sister   . Hyperlipidemia Sister   . Hypertension Sister   . Heart disease Sister   . Diabetes Brother   . Heart disease Brother     before age 73  . Hyperlipidemia Brother   . Hypertension Brother   . Peripheral vascular disease Brother   . Heart attack Brother     Surgical History Past Surgical History  Procedure Laterality Date  . Coronary artery bypass graft  2005    (saphenous vein graft to the  left anterior descending, saphenous vein graft to the ramus intermedius     saphenous vein graft to the right coronary artery  . Re-excision prepatellar bursa      Left Knee  . Hammer toe surgery      Right second toe  . Wrist surgery      Right  . Tubal ligation    . Endarterectomy Left 02/24/2014    Procedure: ENDARTERECTOMY CAROTID;  Surgeon: Chuck Hint, MD;  Location: [facility] OR;  Service: Vascular;  Laterality: Left;  . Patch angioplasty Left 02/24/2014    Procedure: PATCH ANGIOPLASTY, Left Carotid Artery;  Surgeon: Chuck Hint, MD;  Location: [facility] OR;  Service: Vascular;  Laterality: Left;    Allergies  Allergen Reactions  . Bacitracin-Polymyxin B Swelling and Rash  . Neomycin-Bacitracin Zn-Polymyx Swelling and Rash  . Polysorbate Swelling and Rash    Polysporins, etc    Current Outpatient Prescriptions  Medication Sig Dispense Refill  . amLODipine (NORVASC) 10 MG tablet Take 1 tablet (10 mg total) by mouth daily. 30 tablet 6  . aspirin 81 MG tablet Take 81 mg by mouth 2 (two) times daily.     . Calcium Carbonate-Vitamin D (CALTRATE 600+D) 600-400 MG-UNIT per tablet Take 3 tablets by mouth daily.     . diphenhydramine-acetaminophen (TYLENOL PM) 25-500 MG TABS Take 1 tablet by mouth at bedtime as needed (for sleep).     Marland Kitchen. FLUoxetine (PROZAC) 20 MG capsule Take 40 mg by mouth daily.     Marland Kitchen. lisinopril (PRINIVIL,ZESTRIL) 40 MG tablet Take 1 tablet by mouth  daily.    . metoprolol succinate (TOPROL-XL) 25 MG 24 hr tablet Take 1 tablet by mouth daily.    . Multiple Vitamin (MULTIVITAMIN) tablet Take 1 tablet by mouth daily.      . rosuvastatin (CRESTOR) 40 MG tablet Take 1 tablet (40 mg total) by mouth daily. 90 tablet 3   No current facility-administered medications for this visit.    Review of Systems : See HPI for pertinent positives and negatives.  Physical Examination  Filed Vitals:   09/22/15 1516 09/22/15 1521  BP: 137/74 116/71  Pulse: 56 54  Temp: 97.1 F (36.2 C)   TempSrc: Oral   Resp: 16   Height: 5' (1.524 m)   Weight: 178 lb (80.74 kg)   SpO2: 95%     Body mass index is 34.76 kg/(m^2).  General: WDWN obese female in NAD GAIT: slight abduction of lower legs, using cane Eyes: PERRLA Pulmonary:  Non-labored, CTAB, no  Rales,  rhonchi, or wheezing.  Cardiac: regular rhythm,  no detected murmur.  VASCULAR EXAM Carotid Bruits Right Left   Negative Negative    Aorta is not palpable. Radial pulses are 1+ palpable and equal.                                                                                                                            LE Pulses Right Left       POPLITEAL  not palpable   not palpable       POSTERIOR TIBIAL   palpable   not palpable        DORSALIS PEDIS      ANTERIOR TIBIAL not palpable   palpable     Gastrointestinal: soft, nontender, BS WNL, no r/g,  no palpable masses.  Musculoskeletal: no muscle atrophy/wasting. M/S 5/5 throughout, extremities without ischemic changes.  Neurologic: A&O X 3; Appropriate Affect, Speech is normal CN 2-12 intact, pain and light touch intact in extremities, Motor exam as listed above.   Non-Invasive Vascular Imaging CAROTID DUPLEX 09/22/2015  Right ICA: <40% stenosis. Left ICA: CEA site with no restenosis. Left subclavian artery velocity of 465 cm/s. No significant change from 09/16/14.   Assessment: Annette Cooper is a 75 y.o. female who  is s/p left carotid endarterectomy with bovine pericardial patch angioplasty on 02/14/2014. She has right subclavian artery stenosis but denies any steal sx's in her right upper extremity. There is greater than 20 mm Hg difference in her brachial systolic pressures, but both radial pulses are equal in quality at 1+, sensation in both hands is equal. She has no history of stroke or TIA.  Today's carotid duplex suggests <40% right ICA stenosis, left ICA is the CEA site with no restenosis. Left subclavian artery velocity of 465 cm/s. No significant change from 09/16/14.   Plan: Follow-up in 1 year with Carotid Duplex scan.   I discussed in depth with the patient the nature of atherosclerosis, and emphasized the importance of maximal medical management including strict control of blood pressure, blood glucose, and lipid levels, obtaining regular exercise, and continued cessation of smoking.  The patient is aware that without maximal medical management the underlying atherosclerotic disease process will progress, limiting the benefit of any interventions. The patient was given information about stroke prevention and what symptoms should prompt the patient to seek immediate medical care. Thank you for allowing [place] to participate in this patient's care.  Charisse March, RN, MSN, FNP-C [facility] Office: [phone]  Clinic Physician: Edilia Bo  09/22/2015 3:04 PM

## 2015-09-22 NOTE — Patient Instructions (Signed)
Stroke Prevention Some medical conditions and behaviors are associated with an increased chance of having a stroke. You may prevent a stroke by making healthy choices and managing medical conditions. HOW CAN I REDUCE MY RISK OF HAVING A STROKE?   Stay physically active. Get at least 30 minutes of activity on most or all days.  Do not smoke. It may also be helpful to avoid exposure to secondhand smoke.  Limit alcohol use. Moderate alcohol use is considered to be:  No more than 2 drinks per day for men.  No more than 1 drink per day for nonpregnant women.  Eat healthy foods. This involves:  Eating 5 or more servings of fruits and vegetables a day.  Making dietary changes that address high blood pressure (hypertension), high cholesterol, diabetes, or obesity.  Manage your cholesterol levels.  Making food choices that are high in fiber and low in saturated fat, trans fat, and cholesterol may control cholesterol levels.  Take any prescribed medicines to control cholesterol as directed by your health care provider.  Manage your diabetes.  Controlling your carbohydrate and sugar intake is recommended to manage diabetes.  Take any prescribed medicines to control diabetes as directed by your health care provider.  Control your hypertension.  Making food choices that are low in salt (sodium), saturated fat, trans fat, and cholesterol is recommended to manage hypertension.  Ask your health care provider if you need treatment to lower your blood pressure. Take any prescribed medicines to control hypertension as directed by your health care provider.  If you are 18-39 years of age, have your blood pressure checked every 3-5 years. If you are 40 years of age or older, have your blood pressure checked every year.  Maintain a healthy weight.  Reducing calorie intake and making food choices that are low in sodium, saturated fat, trans fat, and cholesterol are recommended to manage  weight.  Stop drug abuse.  Avoid taking birth control pills.  Talk to your health care provider about the risks of taking birth control pills if you are over 35 years old, smoke, get migraines, or have ever had a blood clot.  Get evaluated for sleep disorders (sleep apnea).  Talk to your health care provider about getting a sleep evaluation if you snore a lot or have excessive sleepiness.  Take medicines only as directed by your health care provider.  For some people, aspirin or blood thinners (anticoagulants) are helpful in reducing the risk of forming abnormal blood clots that can lead to stroke. If you have the irregular heart rhythm of atrial fibrillation, you should be on a blood thinner unless there is a good reason you cannot take them.  Understand all your medicine instructions.  Make sure that other conditions (such as anemia or atherosclerosis) are addressed. SEEK IMMEDIATE MEDICAL CARE IF:   You have sudden weakness or numbness of the face, arm, or leg, especially on one side of the body.  Your face or eyelid droops to one side.  You have sudden confusion.  You have trouble speaking (aphasia) or understanding.  You have sudden trouble seeing in one or both eyes.  You have sudden trouble walking.  You have dizziness.  You have a loss of balance or coordination.  You have a sudden, severe headache with no known cause.  You have new chest pain or an irregular heartbeat. Any of these symptoms may represent a serious problem that is an emergency. Do not wait to see if the symptoms will   go away. Get medical help at once. Call your local emergency services (911 in [place] Do not drive yourself to the hospital.   This information is not intended to replace advice given to you by your health care provider. Make sure you discuss any questions you have with your health care provider.   Document Released: 08/03/2004 Document Revised: 07/17/2014 Document Reviewed:  12/27/2012 Elsevier Interactive Patient Education 2016 Elsevier Inc.  

## 2015-12-13 ENCOUNTER — Encounter: Payer: Self-pay | Admitting: Cardiology

## 2016-07-14 ENCOUNTER — Encounter: Payer: Self-pay | Admitting: *Deleted

## 2016-07-17 ENCOUNTER — Ambulatory Visit: Payer: Medicare Other | Admitting: Cardiology

## 2016-07-17 NOTE — Progress Notes (Deleted)
Clinical Summary Annette Cooper is a 76 y.o.female seen today for follow up of the following medical problems.   1. HTN  - compliant with medications.  - does not check regularly at home - leg cramps resolved off chlorthalidone.     2. Carotid stenosis - s/p left CEA 02/24/2014 which she recovered well from. She has some right sided disease that is being followed by vascular with plans for repeat carotid US in next few months.  - denies any recent neuro symptoms.   - followed by vascular, last appt 09/2015   3. CAD -prior CABG in 2005 (SVG-LAD, SVG-ramus, SVG-RCA) - LVEF 10/2008 LVEF 50-55%  - denies any chest pain, reports stable chronic DOE that is unchanged.    4. Hyperlipidemia - compliant with statin - last panel 03/2014 TC 179, TG 279 HDL 44 LDL 79   Past Medical History:  Diagnosis Date  . Arthritis    ddd  . CAD (coronary artery disease)   . Chronic airway obstruction, not elsewhere classified    with emphysema.  . Complication of anesthesia   . Depression    History   . Hyperlipidemia   . Hypertension   . Nodule of right lung 01/2002   CT SCAN.  Three nodules were noted on CT in 2001.  Marland Kitchen Peripheral vascular disease (HCC)   . PONV (postoperative nausea and vomiting)   . Shortness of breath      Allergies  Allergen Reactions  . Bacitracin-Polymyxin B Swelling and Rash  . Neomycin-Bacitracin Zn-Polymyx Swelling and Rash  . Polysorbate Swelling and Rash    Polysporins, etc     Current Outpatient Prescriptions  Medication Sig Dispense Refill  . amLODipine (NORVASC) 10 MG tablet Take 1 tablet (10 mg total) by mouth daily. 30 tablet 6  . aspirin 81 MG tablet Take 81 mg by mouth 2 (two) times daily.     . Calcium Carbonate-Vitamin D (CALTRATE 600+D) 600-400 MG-UNIT per tablet Take 3 tablets by mouth daily.     . diphenhydramine-acetaminophen (TYLENOL PM) 25-500 MG TABS Take 1 tablet by mouth at bedtime as needed (for sleep).     Marland Kitchen  FLUoxetine (PROZAC) 20 MG capsule Take 40 mg by mouth daily.     Marland Kitchen lisinopril (PRINIVIL,ZESTRIL) 40 MG tablet Take 1 tablet by mouth daily.    . metoprolol succinate (TOPROL-XL) 25 MG 24 hr tablet Take 1 tablet by mouth daily.    . Multiple Vitamin (MULTIVITAMIN) tablet Take 1 tablet by mouth daily.      . rosuvastatin (CRESTOR) 40 MG tablet Take 1 tablet (40 mg total) by mouth daily. 90 tablet 3   No current facility-administered medications for this visit.      Past Surgical History:  Procedure Laterality Date  . CORONARY ARTERY BYPASS GRAFT  2005   (saphenous vein graft to the  left anterior descending, saphenous vein graft to the ramus intermedius     saphenous vein graft to the right coronary artery  . ENDARTERECTOMY Left 02/24/2014   Procedure: ENDARTERECTOMY CAROTID;  Surgeon: Chuck Hint, MD;  Location: [facility] OR;  Service: Vascular;  Laterality: Left;  . HAMMER TOE SURGERY     Right second toe  . PATCH ANGIOPLASTY Left 02/24/2014   Procedure: PATCH ANGIOPLASTY, Left Carotid Artery;  Surgeon: Chuck Hint, MD;  Location: [facility] OR;  Service: Vascular;  Laterality: Left;  . Re-excision prepatellar bursa     Left Knee  . TUBAL LIGATION    .  WRIST SURGERY     Right     Allergies  Allergen Reactions  . Bacitracin-Polymyxin B Swelling and Rash  . Neomycin-Bacitracin Zn-Polymyx Swelling and Rash  . Polysorbate Swelling and Rash    Polysporins, etc      Family History  Problem Relation Age of Onset  . Diabetes Mother   . Hyperlipidemia Mother   . Hypertension Mother   . Peripheral vascular disease Mother     amputation  . Diabetes Father   . Heart disease Father     beofre age 76  . Hyperlipidemia Father   . Hypertension Father   . Heart attack Father   . Diabetes Sister   . Hyperlipidemia Sister   . Hypertension Sister   . Heart disease Sister   . Diabetes Brother   . Heart disease Brother     before age 76  . Hyperlipidemia Brother   .  Hypertension Brother   . Peripheral vascular disease Brother   . Heart attack Brother      Social History Annette Cooper reports that she quit smoking about 12 years ago. Her smoking use included Cigarettes. She has a 50.00 pack-year smoking history. She has never used smokeless tobacco. Annette Cooper reports that she does not drink alcohol.   Review of Systems CONSTITUTIONAL: No weight loss, fever, chills, weakness or fatigue.  HEENT: Eyes: No visual loss, blurred vision, double vision or yellow sclerae.No hearing loss, sneezing, congestion, runny nose or sore throat.  SKIN: No rash or itching.  CARDIOVASCULAR:  RESPIRATORY: No shortness of breath, cough or sputum.  GASTROINTESTINAL: No anorexia, nausea, vomiting or diarrhea. No abdominal pain or blood.  GENITOURINARY: No burning on urination, no polyuria NEUROLOGICAL: No headache, dizziness, syncope, paralysis, ataxia, numbness or tingling in the extremities. No change in bowel or bladder control.  MUSCULOSKELETAL: No muscle, back pain, joint pain or stiffness.  LYMPHATICS: No enlarged nodes. No history of splenectomy.  PSYCHIATRIC: No history of depression or anxiety.  ENDOCRINOLOGIC: No reports of sweating, cold or heat intolerance. No polyuria or polydipsia.  Marland Kitchen.   Physical Examination There were no vitals filed for this visit. There were no vitals filed for this visit.  Gen: resting comfortably, no acute distress HEENT: no scleral icterus, pupils equal round and reactive, no palptable cervical adenopathy,  CV Resp: Clear to auscultation bilaterally GI: abdomen is soft, non-tender, non-distended, normal bowel sounds, no hepatosplenomegaly MSK: extremities are warm, no edema.  Skin: warm, no rash Neuro:  no focal deficits Psych: appropriate affect   Diagnostic Studies 09/2014 Carotid US Patent left CEA, <40% RICA disease.    10/2012 Echo LVEF 50-55%, grade II diastolic dysfunction  12/2011 Exercise MPI No  ischemia    Assessment and Plan  1. HTN  -at goal, continue current meds   2. CAD - no current symptoms, continue risk factor modification and secondary preventoin  3. Hyperlipidemia - continue high dose statin - request labs from pcp  4. Carotis stenosis - s/p left CEA, monitoring right sided disease. She will continue to follow with vascular.    F/u 1 year      Antoine PocheJonathan F. Betty Daidone, M.D., F.A.C.C.

## 2016-08-28 ENCOUNTER — Encounter: Payer: Self-pay | Admitting: Cardiology

## 2016-08-28 ENCOUNTER — Ambulatory Visit (INDEPENDENT_AMBULATORY_CARE_PROVIDER_SITE_OTHER): Payer: Medicare Other | Admitting: Cardiology

## 2016-08-28 VITALS — BP 180/110 | HR 66 | Ht 61.0 in | Wt 185.2 lb

## 2016-08-28 DIAGNOSIS — I10 Essential (primary) hypertension: Secondary | ICD-10-CM | POA: Diagnosis not present

## 2016-08-28 DIAGNOSIS — E782 Mixed hyperlipidemia: Secondary | ICD-10-CM | POA: Diagnosis not present

## 2016-08-28 DIAGNOSIS — I2510 Atherosclerotic heart disease of native coronary artery without angina pectoris: Secondary | ICD-10-CM | POA: Diagnosis not present

## 2016-08-28 DIAGNOSIS — I6523 Occlusion and stenosis of bilateral carotid arteries: Secondary | ICD-10-CM

## 2016-08-28 NOTE — Progress Notes (Signed)
Clinical Summary Ms. Sindy GuadeloupeGonyer is a 76 y.o.female seen today for follow up of the following medical problems.   1. HTN  - leg cramps resolved off chlorthalidone.  - does not check bp at home regularly.    2. Carotid stenosis - s/p left CEA 02/24/2014 which she recovered well from. She has some right sided disease that is being followed by vascular with plans for repeat carotid US in next few months.  -no recent neuro symptoms - given the driving distance, she would like to have he surveillance imaging done at our office.   3. CAD -prior CABG in 2005 (SVG-LAD, SVG-ramus, SVG-RCA) - LVEF 10/2008 LVEF 50-55%  - no recent chest pain. Occasional SOB that is chronic and unchanged - compliant with meds  4. Hyperlipidemia - compliant with statin - last panel 09/2015 TC 145 TG 184 HDL 48 LDL 60    Past Medical History:  Diagnosis Date  . Arthritis    ddd  . CAD (coronary artery disease)   . Chronic airway obstruction, not elsewhere classified    with emphysema.  . Complication of anesthesia   . Depression    History   . Hyperlipidemia   . Hypertension   . Nodule of right lung 01/2002   CT SCAN.  Three nodules were noted on CT in 2001.  Marland Kitchen. Peripheral vascular disease (HCC)   . PONV (postoperative nausea and vomiting)   . Shortness of breath      Allergies  Allergen Reactions  . Bacitracin-Polymyxin B Swelling and Rash  . Neomycin-Bacitracin Zn-Polymyx Swelling and Rash  . Polysorbate Swelling and Rash    Polysporins, etc     Current Outpatient Prescriptions  Medication Sig Dispense Refill  . amLODipine (NORVASC) 10 MG tablet Take 1 tablet (10 mg total) by mouth daily. 30 tablet 6  . aspirin 81 MG tablet Take 81 mg by mouth 2 (two) times daily.     . Calcium Carbonate-Vitamin D (CALTRATE 600+D) 600-400 MG-UNIT per tablet Take 3 tablets by mouth daily.     . diphenhydramine-acetaminophen (TYLENOL PM) 25-500 MG TABS Take 1 tablet by mouth at bedtime as needed  (for sleep).     Marland Kitchen. FLUoxetine (PROZAC) 20 MG capsule Take 40 mg by mouth daily.     Marland Kitchen. lisinopril (PRINIVIL,ZESTRIL) 40 MG tablet Take 1 tablet by mouth daily.    . metoprolol succinate (TOPROL-XL) 25 MG 24 hr tablet Take 1 tablet by mouth daily.    . Multiple Vitamin (MULTIVITAMIN) tablet Take 1 tablet by mouth daily.      . rosuvastatin (CRESTOR) 40 MG tablet Take 1 tablet (40 mg total) by mouth daily. 90 tablet 3   No current facility-administered medications for this visit.      Past Surgical History:  Procedure Laterality Date  . CORONARY ARTERY BYPASS GRAFT  2005   (saphenous vein graft to the  left anterior descending, saphenous vein graft to the ramus intermedius     saphenous vein graft to the right coronary artery  . ENDARTERECTOMY Left 02/24/2014   Procedure: ENDARTERECTOMY CAROTID;  Surgeon: Chuck Hinthristopher S Dickson, MD;  [facility];  Service: Vascular;  Laterality: Left;  . HAMMER TOE SURGERY     Right second toe  . PATCH ANGIOPLASTY Left 02/24/2014   Procedure: PATCH ANGIOPLASTY, Left Carotid Artery;  Surgeon: Chuck Hinthristopher S Dickson, MD;  [facility];  Service: Vascular;  Laterality: Left;  . Re-excision prepatellar bursa     Left Knee  .  TUBAL LIGATION    . WRIST SURGERY     Right     Allergies  Allergen Reactions  . Bacitracin-Polymyxin B Swelling and Rash  . Neomycin-Bacitracin Zn-Polymyx Swelling and Rash  . Polysorbate Swelling and Rash    Polysporins, etc      Family History  Problem Relation Age of Onset  . Diabetes Mother   . Hyperlipidemia Mother   . Hypertension Mother   . Peripheral vascular disease Mother     amputation  . Diabetes Father   . Heart disease Father     beofre age 44  . Hyperlipidemia Father   . Hypertension Father   . Heart attack Father   . Diabetes Sister   . Hyperlipidemia Sister   . Hypertension Sister   . Heart disease Sister   . Diabetes Brother   . Heart disease Brother     before age 17  . Hyperlipidemia  Brother   . Hypertension Brother   . Peripheral vascular disease Brother   . Heart attack Brother      Social History Ms. Goughnour reports that she quit smoking about 13 years ago. Her smoking use included Cigarettes. She has a 50.00 pack-year smoking history. She has never used smokeless tobacco. Ms. Spillman reports that she does not drink alcohol.   Review of Systems CONSTITUTIONAL: No weight loss, fever, chills, weakness or fatigue.  HEENT: Eyes: No visual loss, blurred vision, double vision or yellow sclerae.No hearing loss, sneezing, congestion, runny nose or sore throat.  SKIN: No rash or itching.  CARDIOVASCULAR: per HPI RESPIRATORY: No shortness of breath, cough or sputum.  GASTROINTESTINAL: No anorexia, nausea, vomiting or diarrhea. No abdominal pain or blood.  GENITOURINARY: No burning on urination, no polyuria NEUROLOGICAL: No headache, dizziness, syncope, paralysis, ataxia, numbness or tingling in the extremities. No change in bowel or bladder control.  MUSCULOSKELETAL: No muscle, back pain, joint pain or stiffness.  LYMPHATICS: No enlarged nodes. No history of splenectomy.  PSYCHIATRIC: No history of depression or anxiety.  ENDOCRINOLOGIC: No reports of sweating, cold or heat intolerance. No polyuria or polydipsia.  Marland Kitchen   Physical Examination Vitals:   08/28/16 1324  BP: (!) 180/110  Pulse: 66   Vitals:   08/28/16 1324  Weight: 185 lb 3.2 oz (84 kg)  Height: 5\' 1"  (1.549 m)    Gen: resting comfortably, no acute distress HEENT: no scleral icterus, pupils equal round and reactive, no palptable cervical adenopathy,  CV: RRR, no m/r/g, no jvd. Bilateral carotid bruits Resp: Clear to auscultation bilaterally GI: abdomen is soft, non-tender, non-distended, normal bowel sounds, no hepatosplenomegaly MSK: extremities are warm, no edema.  Skin: warm, no rash Neuro:  no focal deficits Psych: appropriate affect   Diagnostic Studies  09/2014 Carotid US Patent left  CEA, <40% RICA disease.    10/2012 Echo LVEF 50-55%, grade II diastolic dysfunction  12/2011 Exercise MPI No ischemia   Assessment and Plan   1. HTN  -elevated in clinic - she reports a history of white coat HTN - she will monitor her bp's at home this week and call us Friday to update [place] on results.    2. CAD - no current symptoms - we will continue current meds  3. Hyperlipidemia - continue high dose statin  4. Carotis stenosis - s/p left CEA, monitoring right sided disease. - we will repeat carotid US   F/u 1 year     Antoine Poche, M.D.

## 2016-08-28 NOTE — Patient Instructions (Signed)
Your physician wants you to follow-up in: 1 YEAR WITH DR. BRANCH  You will receive a reminder letter in the mail two months in advance. If you don't receive a letter, please call our office to schedule the follow-up appointment.  Your physician recommends that you continue on your current medications as directed. Please refer to the Current Medication list given to you today.  Your physician has requested that you regularly monitor and record your blood pressure readings at home AND CALL US Friday WITH READINGS  Your physician has requested that you have a carotid duplex. This test is an ultrasound of the carotid arteries in your neck. It looks at blood flow through these arteries that supply the brain with blood. Allow one hour for this exam. There are no restrictions or special instructions.  Thank you for choosing [facility]!!

## 2016-09-01 ENCOUNTER — Telehealth: Payer: Self-pay | Admitting: *Deleted

## 2016-09-01 DIAGNOSIS — I10 Essential (primary) hypertension: Secondary | ICD-10-CM

## 2016-09-01 MED ORDER — CHLORTHALIDONE 25 MG PO TABS: 12.5000 mg | ORAL_TABLET | Freq: Every day | ORAL | 3 refills | Status: DC

## 2016-09-01 NOTE — Telephone Encounter (Signed)
BP's running too high. Can we start her on chlorthalidone 12.5mg  daily, check BMET and Mg in 2 weeks.

## 2016-09-01 NOTE — Telephone Encounter (Signed)
Patient calling in with BP readings per Dr. Wyline MoodBranch -    2/20 - 178/74   2/21 - 182/73 2/22 - 179/59  2/23  - 182/72   Questioning if she could go back on Chlorthalidone, was on this in the past.

## 2016-09-01 NOTE — Telephone Encounter (Signed)
Patient notified.  New rx sent to pharmacy now.  Will mail lab orders to home as she [facility] to [facility] to [facility].

## 2016-09-21 ENCOUNTER — Ambulatory Visit: Payer: Medicare Other

## 2016-09-21 DIAGNOSIS — I6523 Occlusion and stenosis of bilateral carotid arteries: Secondary | ICD-10-CM

## 2016-09-22 LAB — VAS US CAROTID
LCCADDIAS: -11 cm/s
LEFT ECA DIAS: 0 cm/s
LEFT VERTEBRAL DIAS: -14 cm/s
LICAPDIAS: -22 cm/s
Left CCA dist sys: -123 cm/s
Left CCA prox dias: 17 cm/s
Left CCA prox sys: 135 cm/s
Left ICA dist dias: -27 cm/s
Left ICA dist sys: -110 cm/s
Left ICA prox sys: -84 cm/s
RCCADSYS: -69 cm/s
RIGHT ECA DIAS: 0 cm/s
RIGHT VERTEBRAL DIAS: -13 cm/s
Right CCA prox dias: 6 cm/s
Right CCA prox sys: 186 cm/s

## 2016-09-27 ENCOUNTER — Encounter (HOSPITAL_COMMUNITY): Payer: Medicare Other

## 2016-09-27 ENCOUNTER — Ambulatory Visit: Payer: Medicare Other | Admitting: Family

## 2016-09-29 ENCOUNTER — Telehealth: Payer: Self-pay | Admitting: *Deleted

## 2016-09-29 NOTE — Telephone Encounter (Signed)
Pt aware - routed to pcp  

## 2016-09-29 NOTE — Telephone Encounter (Signed)
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

## 2017-05-09 ENCOUNTER — Telehealth: Payer: Self-pay | Admitting: Cardiology

## 2017-05-09 MED ORDER — CHLORTHALIDONE 25 MG PO TABS: 25.0000 mg | ORAL_TABLET | Freq: Every day | ORAL | 0 refills | Status: DC

## 2017-05-09 NOTE — Telephone Encounter (Signed)
° ° ° °  1. Which medications need to be refilled? (please list name of each medication and dose if known)  chlorthalidone (HYGROTON) 25 MG tablet    2. Which pharmacy/location (including street and city if local pharmacy) is medication to be sent to? [facility] [place]   3. Do they need a 30 day or 90 day supply? 90   Patient states that she is taking a whole pill now.  States that she needs refill...  [phone] (home)

## 2017-05-09 NOTE — Telephone Encounter (Signed)
Patient declined any blood work at this time.

## 2017-05-09 NOTE — Telephone Encounter (Signed)
Ok to refill higher dose. If she has not had a BMET/Mg since increase please order one for her. If she has please call pcp for results   Dominga FerryJ Jamarl Pew MD

## 2017-05-09 NOTE — Addendum Note (Signed)
Addended by: Norva PavlovJOYCE, Makani Seckman on: 05/09/2017 02:16 PM   Modules accepted: Orders

## 2017-05-09 NOTE — Telephone Encounter (Signed)
LMTCB

## 2017-05-09 NOTE — Telephone Encounter (Signed)
Patient states her BP was running high a lot and PCP advised her to begin taking 1 tablet of the chlorthalidone instead of 0.5 tablet. Patient wants to know if we will refill prescription for 1 whole tablet. Patient stated she went to see PCP last week and BP has been running good. Patient could not recall any exact readings and does not have a way to check at home. Advised patient we would discuss with Dr. Wyline MoodBranch.

## 2017-07-28 ENCOUNTER — Other Ambulatory Visit: Payer: Self-pay | Admitting: Cardiology

## 2017-11-24 ENCOUNTER — Other Ambulatory Visit: Payer: Self-pay | Admitting: Cardiology

## 2018-02-13 ENCOUNTER — Other Ambulatory Visit: Payer: Self-pay | Admitting: Cardiology

## 2018-02-14 ENCOUNTER — Other Ambulatory Visit: Payer: Self-pay | Admitting: Cardiology

## 2018-02-16 ENCOUNTER — Other Ambulatory Visit: Payer: Self-pay | Admitting: Cardiology

## 2018-05-22 ENCOUNTER — Telehealth: Payer: Self-pay | Admitting: Cardiology

## 2018-05-22 NOTE — Telephone Encounter (Signed)
Numerous attempts to contact patient with recall letters. Unable to reach by telephone. with no success.   Annette Cooper, Annette Cooper [[id number]][[id number]] 08/28/2016 2:04 PM New [10]    [System] 05/10/2017 11:03 PM Notification Sent [20]   Annette Cooper, Annette Cooper [[id number]][[id number]] 05/01/2018 9:51 AM Notification Sent [20]   Annette Cooper, Annette Cooper [[id number]][[id number]] 05/22/2018 9:08 AM Notification Sent [20]
